# Patient Record
Sex: Female | Born: 1935 | Race: White | Hispanic: No
Health system: Southern US, Community
[De-identification: ages and names within clinical notes are randomized; demographics above are authoritative.]

---

## 2004-08-01 ENCOUNTER — Other Ambulatory Visit: Payer: Self-pay

## 2005-01-26 ENCOUNTER — Emergency Department: Payer: Self-pay | Admitting: Emergency Medicine

## 2005-02-09 ENCOUNTER — Emergency Department: Payer: Self-pay | Admitting: Internal Medicine

## 2006-06-30 ENCOUNTER — Emergency Department: Payer: Self-pay | Admitting: Emergency Medicine

## 2009-09-09 ENCOUNTER — Emergency Department: Payer: Self-pay | Admitting: Emergency Medicine

## 2010-12-28 ENCOUNTER — Emergency Department: Payer: Self-pay | Admitting: Emergency Medicine

## 2012-03-09 IMAGING — CT CT HEAD WITHOUT CONTRAST
2 series · 16 of 30 positions shown, 20 images · non-contrast
Comparison: none

REASON FOR EXAM: fall
COMMENTS:

PROCEDURE:     CT  - CT HEAD WITHOUT CONTRAST  - December 28, 2010  [DATE]
RESULT:     Head CT dated 12/28/2010
TECHNIQUE: A helical noncontrasted 5 mm sections were obtained from the
skull base to vertex.

[Series 2: without · axial · non-contrast · 0.43mm/px · z∈[+302,+422]mm · 13 of 30 slices shown, 17 images]
[im 3/30  brain]
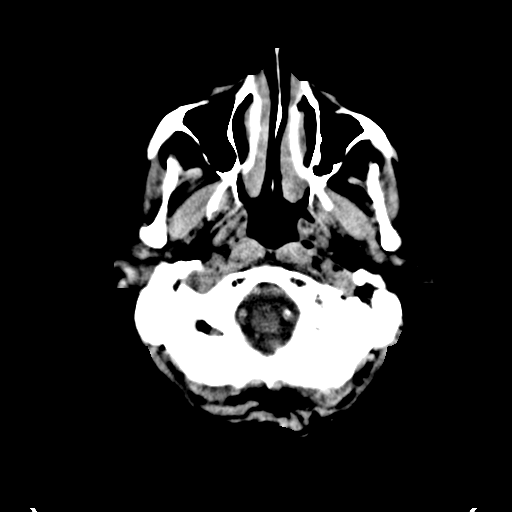
[im 3/30  bone]
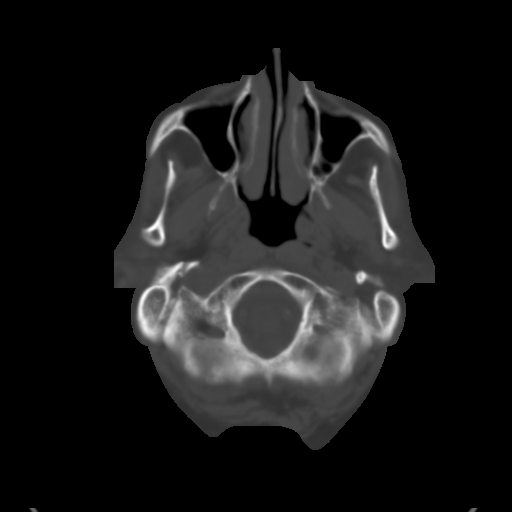
[im 5/30  brain]
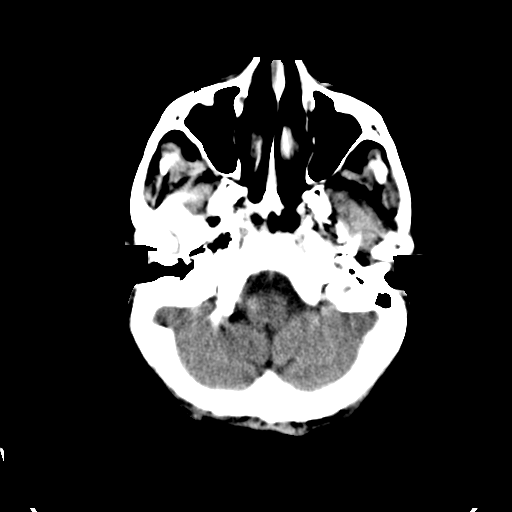
[im 7/30  brain]
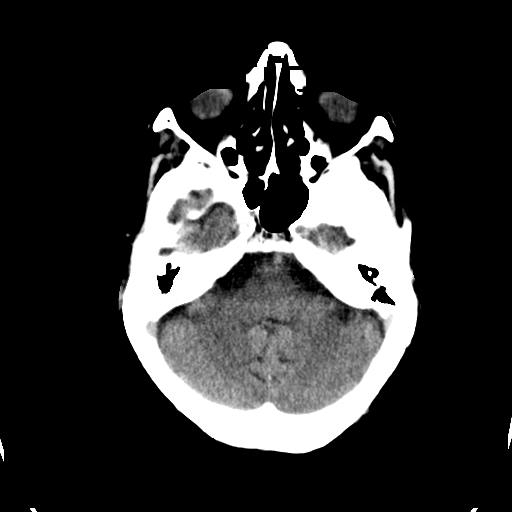
[im 9/30  brain]
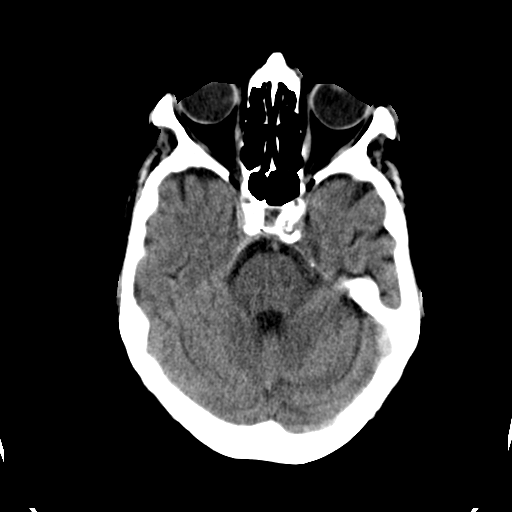
[im 11/30  brain]
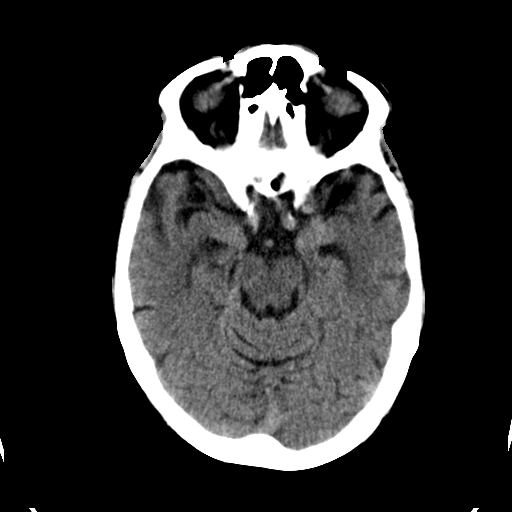
[im 11/30  bone]
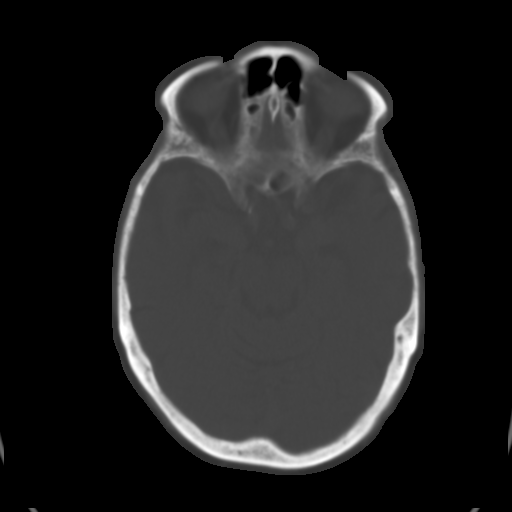
[im 13/30  brain]
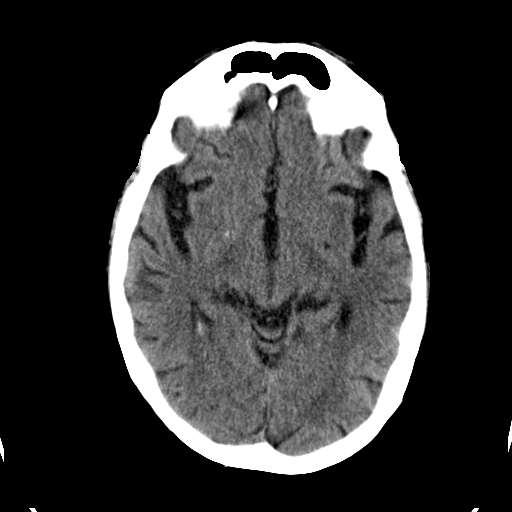
[im 15/30  brain]
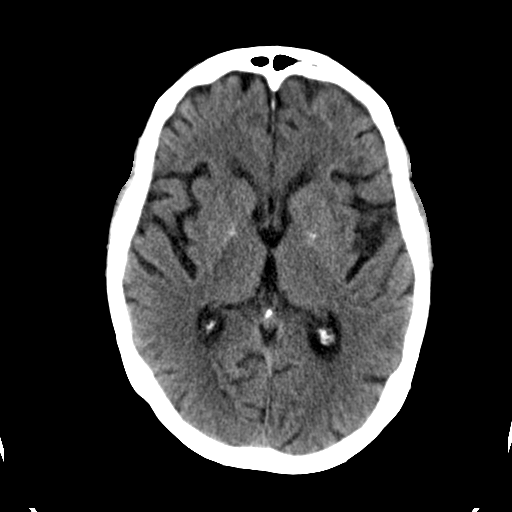
[im 17/30  brain]
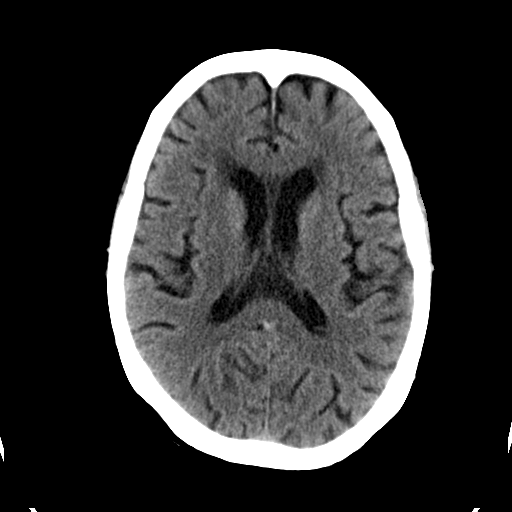
[im 19/30  brain]
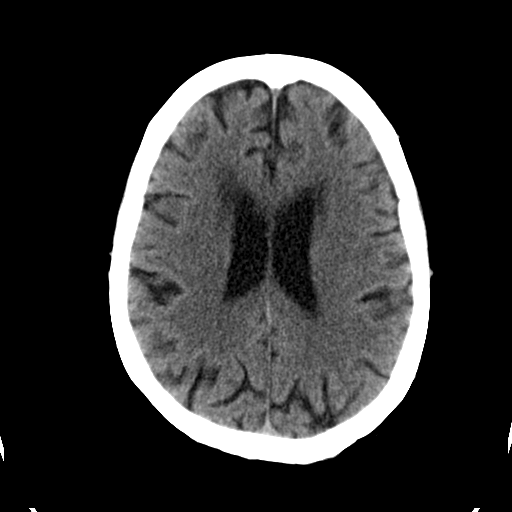
[im 19/30  bone]
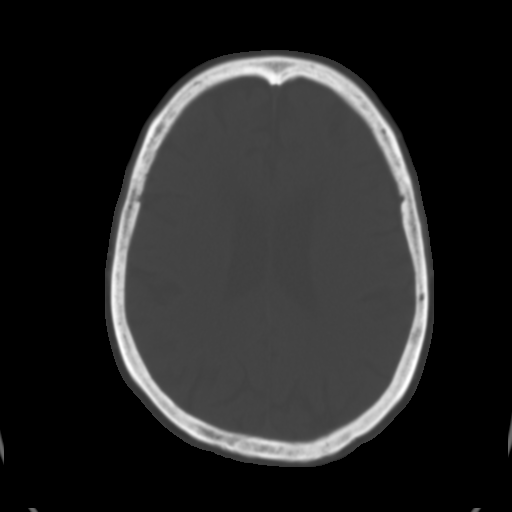
[im 21/30  brain]
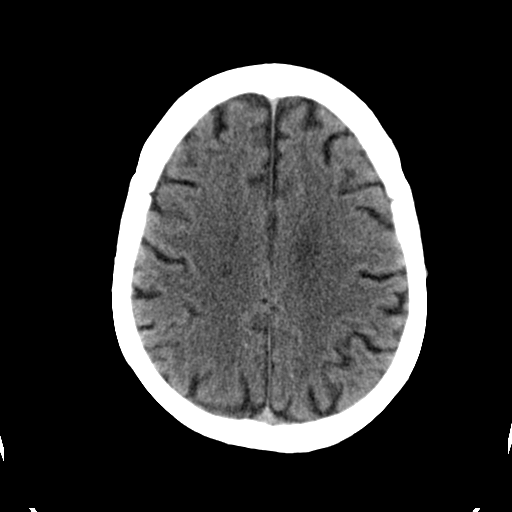
[im 23/30  brain]
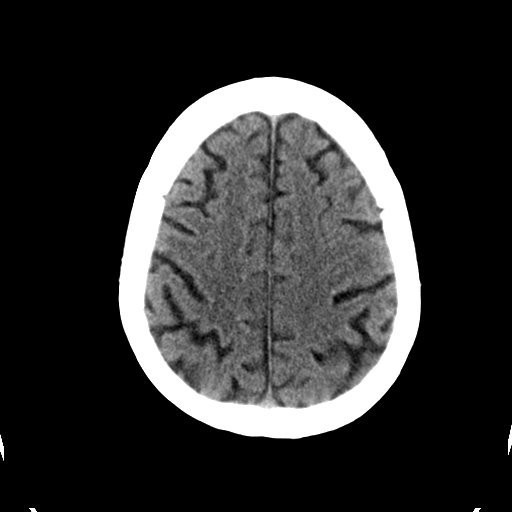
[im 25/30  brain]
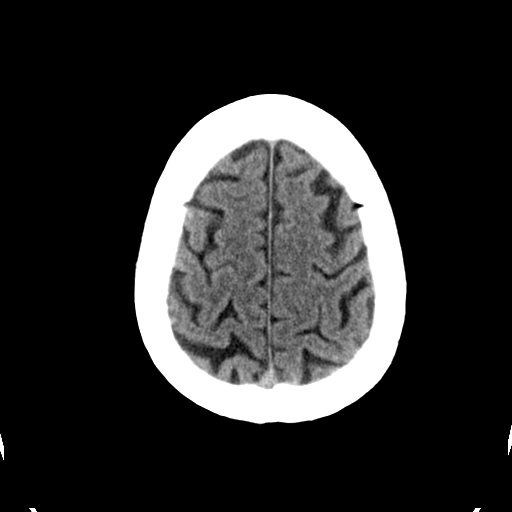
[im 27/30  brain]
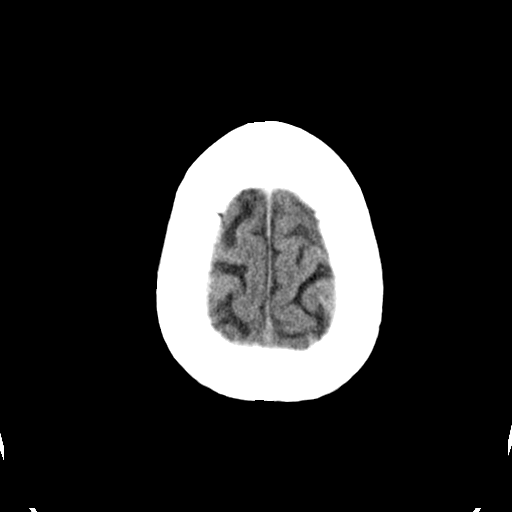
[im 27/30  bone]
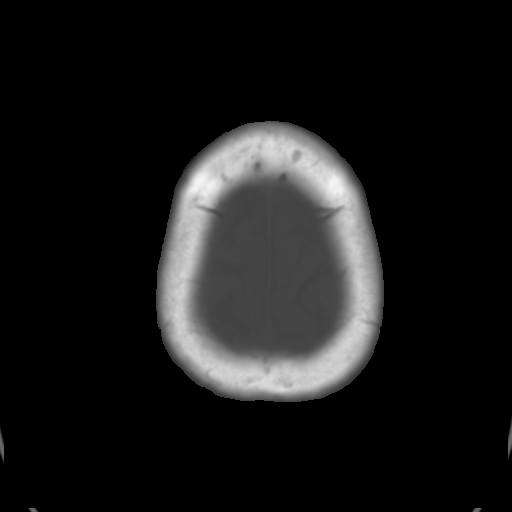

[Series 3: bone · axial · 0.43mm/px · z∈[+302,+342]mm · 3 of 30 slices shown]
[im 3/30  bone]
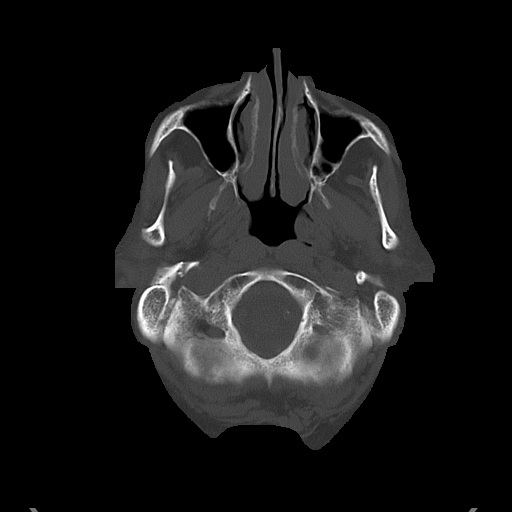
[im 7/30  bone]
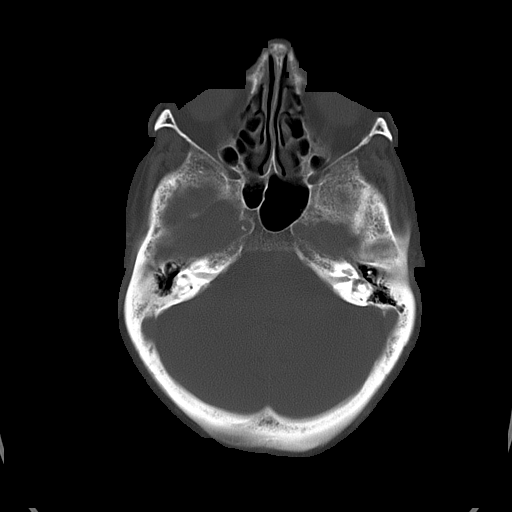
[im 11/30  bone]
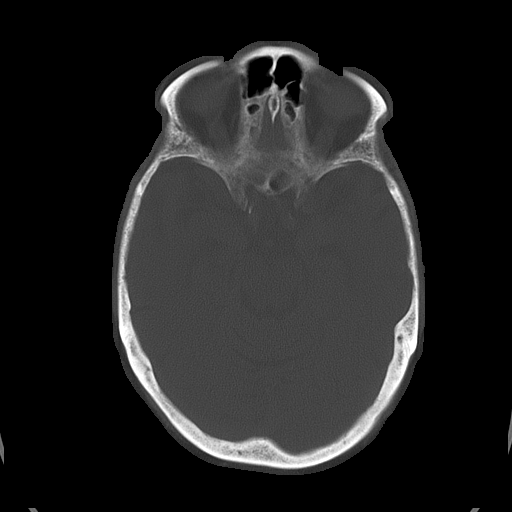

[16 of 30 positions shown; findings below may reference images not displayed]

FINDINGS: There is no evidence of intra-axial nor extra-axial fluid
collections nor evidence of acute hemorrhage. There is diffuse cortical
atrophy and areas of low attenuation within the subcortical, deep, and
periventricular white matter regions. Basal ganglial calcifications
identified.
The ventricles and cisterns are patent. There is no evidence of mass effect.
There is no evidence of a depressed skull fracture. Gross evaluation of the
pons and cerebellum is unremarkable.
IMPRESSION: Involutional changes without evidence of focal or acute
abnormalities.

## 2012-04-23 ENCOUNTER — Emergency Department: Payer: Self-pay | Admitting: Emergency Medicine

## 2012-04-23 LAB — BASIC METABOLIC PANEL
Anion Gap: 11 (ref 7–16)
BUN: 15 mg/dL (ref 7–18)
Creatinine: 1.17 mg/dL (ref 0.60–1.30)
EGFR (African American): 50 — ABNORMAL LOW
EGFR (Non-African Amer.): 43 — ABNORMAL LOW
Osmolality: 292 (ref 275–301)
Potassium: 4.2 mmol/L (ref 3.5–5.1)
Sodium: 144 mmol/L (ref 136–145)

## 2012-04-23 LAB — CBC WITH DIFFERENTIAL/PLATELET
Basophil #: 0.1 10*3/uL (ref 0.0–0.1)
Basophil %: 1.2 %
Eosinophil %: 2.7 %
Lymphocyte %: 32.3 %
MCH: 28.5 pg (ref 26.0–34.0)
MCHC: 31.6 g/dL — ABNORMAL LOW (ref 32.0–36.0)
MCV: 90 fL (ref 80–100)
Monocyte #: 0.6 x10 3/mm (ref 0.2–0.9)
Monocyte %: 8.5 %
Neutrophil %: 55.3 %
Platelet: 189 10*3/uL (ref 150–440)
RBC: 4.78 10*6/uL (ref 3.80–5.20)
WBC: 6.5 10*3/uL (ref 3.6–11.0)

## 2012-04-27 ENCOUNTER — Inpatient Hospital Stay: Payer: Self-pay | Admitting: Internal Medicine

## 2012-04-27 LAB — CBC WITH DIFFERENTIAL/PLATELET
Basophil #: 0.1 10*3/uL (ref 0.0–0.1)
Lymphocyte #: 2 10*3/uL (ref 1.0–3.6)
Lymphocyte %: 26.7 %
MCH: 29.1 pg (ref 26.0–34.0)
MCHC: 32.9 g/dL (ref 32.0–36.0)
MCV: 88 fL (ref 80–100)
Neutrophil #: 4.5 10*3/uL (ref 1.4–6.5)
Neutrophil %: 62.2 %
Platelet: 200 10*3/uL (ref 150–440)
RBC: 4.72 10*6/uL (ref 3.80–5.20)

## 2012-04-27 LAB — URINALYSIS, COMPLETE
Bilirubin,UR: NEGATIVE
Glucose,UR: NEGATIVE mg/dL (ref 0–75)
Ketone: NEGATIVE
Leukocyte Esterase: NEGATIVE
Ph: 6 (ref 4.5–8.0)
RBC,UR: <1 /HPF (ref 0–5)
Squamous Epithelial: <1

## 2012-04-27 LAB — COMPREHENSIVE METABOLIC PANEL
Bilirubin,Total: 0.6 mg/dL (ref 0.2–1.0)
Calcium, Total: 8.9 mg/dL (ref 8.5–10.1)
EGFR (Non-African Amer.): 41 — ABNORMAL LOW
Glucose: 135 mg/dL — ABNORMAL HIGH (ref 65–99)
SGPT (ALT): 20 U/L
Sodium: 140 mmol/L (ref 136–145)
Total Protein: 7 g/dL (ref 6.4–8.2)

## 2012-04-28 LAB — CBC WITH DIFFERENTIAL/PLATELET
Basophil #: 0 10*3/uL (ref 0.0–0.1)
Basophil %: 0.7 %
Lymphocyte #: 1.7 10*3/uL (ref 1.0–3.6)
MCH: 29.1 pg (ref 26.0–34.0)
Monocyte %: 8.7 %
Neutrophil #: 3.8 10*3/uL (ref 1.4–6.5)
RBC: 4.67 10*6/uL (ref 3.80–5.20)
RDW: 13.6 % (ref 11.5–14.5)

## 2012-04-28 LAB — BASIC METABOLIC PANEL
Calcium, Total: 8.9 mg/dL (ref 8.5–10.1)
Glucose: 98 mg/dL (ref 65–99)

## 2012-04-28 LAB — HEMOGLOBIN A1C: Hemoglobin A1C: 6.8 % — ABNORMAL HIGH (ref 4.2–6.3)

## 2012-04-30 LAB — BASIC METABOLIC PANEL
Anion Gap: 10 (ref 7–16)
BUN: 16 mg/dL (ref 7–18)
Calcium, Total: 8.6 mg/dL (ref 8.5–10.1)
Chloride: 104 mmol/L (ref 98–107)
Creatinine: 1.05 mg/dL (ref 0.60–1.30)
EGFR (African American): 56 — ABNORMAL LOW
Glucose: 127 mg/dL — ABNORMAL HIGH (ref 65–99)
Osmolality: 280 (ref 275–301)
Potassium: 3.9 mmol/L (ref 3.5–5.1)

## 2012-05-01 LAB — WOUND CULTURE

## 2012-05-03 LAB — CULTURE, BLOOD (SINGLE)

## 2012-05-15 ENCOUNTER — Emergency Department: Payer: Self-pay | Admitting: *Deleted

## 2012-05-15 LAB — CBC
MCHC: 32.2 g/dL (ref 32.0–36.0)
Platelet: 221 10*3/uL (ref 150–440)
RDW: 14.1 % (ref 11.5–14.5)

## 2012-05-15 LAB — BASIC METABOLIC PANEL
Anion Gap: 6 — ABNORMAL LOW (ref 7–16)
BUN: 23 mg/dL — ABNORMAL HIGH (ref 7–18)
Co2: 26 mmol/L (ref 21–32)
Creatinine: 1.24 mg/dL (ref 0.60–1.30)
EGFR (African American): 46 — ABNORMAL LOW
EGFR (Non-African Amer.): 40 — ABNORMAL LOW
Glucose: 76 mg/dL (ref 65–99)
Potassium: 4 mmol/L (ref 3.5–5.1)

## 2012-05-15 LAB — URINALYSIS, COMPLETE
Bilirubin,UR: NEGATIVE
Ketone: NEGATIVE
Leukocyte Esterase: NEGATIVE
Nitrite: NEGATIVE
Ph: 6 (ref 4.5–8.0)
Protein: NEGATIVE
RBC,UR: 1 /HPF (ref 0–5)
Specific Gravity: 1.011 (ref 1.003–1.030)
Squamous Epithelial: 1
WBC UR: 2 /HPF (ref 0–5)

## 2013-11-26 ENCOUNTER — Emergency Department: Payer: Self-pay | Admitting: Emergency Medicine

## 2014-05-15 ENCOUNTER — Emergency Department: Payer: Self-pay | Admitting: Emergency Medicine

## 2014-05-15 LAB — URINALYSIS, COMPLETE
Bilirubin,UR: NEGATIVE
Blood: NEGATIVE
Glucose,UR: NEGATIVE mg/dL (ref 0–75)
NITRITE: NEGATIVE
Ph: 6 (ref 4.5–8.0)
Protein: NEGATIVE
RBC,UR: 1 /HPF (ref 0–5)
Specific Gravity: 1.019 (ref 1.003–1.030)
WBC UR: 6 /HPF (ref 0–5)

## 2014-05-15 LAB — CBC
HCT: 46.3 % (ref 35.0–47.0)
HGB: 15.3 g/dL (ref 12.0–16.0)
MCH: 30 pg (ref 26.0–34.0)
MCHC: 33.1 g/dL (ref 32.0–36.0)
MCV: 91 fL (ref 80–100)
Platelet: 158 10*3/uL (ref 150–440)
RBC: 5.11 10*6/uL (ref 3.80–5.20)
RDW: 14.1 % (ref 11.5–14.5)
WBC: 6.2 10*3/uL (ref 3.6–11.0)

## 2014-05-15 LAB — COMPREHENSIVE METABOLIC PANEL
ANION GAP: 6 — AB (ref 7–16)
Albumin: 3.5 g/dL (ref 3.4–5.0)
Alkaline Phosphatase: 73 U/L
BUN: 19 mg/dL — ABNORMAL HIGH (ref 7–18)
Bilirubin,Total: 0.7 mg/dL (ref 0.2–1.0)
CO2: 28 mmol/L (ref 21–32)
Calcium, Total: 9.5 mg/dL (ref 8.5–10.1)
Chloride: 103 mmol/L (ref 98–107)
Creatinine: 1.21 mg/dL (ref 0.60–1.30)
GFR CALC AF AMER: 47 — AB
GFR CALC NON AF AMER: 40 — AB
GLUCOSE: 158 mg/dL — AB (ref 65–99)
Osmolality: 279 (ref 275–301)
POTASSIUM: 4 mmol/L (ref 3.5–5.1)
SGOT(AST): 37 U/L (ref 15–37)
SGPT (ALT): 24 U/L (ref 12–78)
Sodium: 137 mmol/L (ref 136–145)
Total Protein: 7.4 g/dL (ref 6.4–8.2)

## 2014-05-15 LAB — LIPASE, BLOOD: LIPASE: 99 U/L (ref 73–393)

## 2014-11-21 DIAGNOSIS — N3941 Urge incontinence: Secondary | ICD-10-CM | POA: Diagnosis not present

## 2014-12-10 DIAGNOSIS — N189 Chronic kidney disease, unspecified: Secondary | ICD-10-CM | POA: Diagnosis not present

## 2014-12-10 DIAGNOSIS — D649 Anemia, unspecified: Secondary | ICD-10-CM | POA: Diagnosis not present

## 2014-12-10 DIAGNOSIS — E119 Type 2 diabetes mellitus without complications: Secondary | ICD-10-CM | POA: Diagnosis not present

## 2014-12-10 DIAGNOSIS — E780 Pure hypercholesterolemia: Secondary | ICD-10-CM | POA: Diagnosis not present

## 2014-12-15 DIAGNOSIS — J811 Chronic pulmonary edema: Secondary | ICD-10-CM | POA: Diagnosis not present

## 2015-02-17 ENCOUNTER — Emergency Department
Admit: 2015-02-17 | Disposition: A | Discharge status: Other Acute Inpatient Hospital | Payer: Self-pay | Admitting: Internal Medicine

## 2015-02-17 ENCOUNTER — Ambulatory Visit (HOSPITAL_COMMUNITY)
Admission: AD | Admit: 2015-02-17 | Discharge: 2015-02-17 | Disposition: A | Discharge status: Home / Self Care | Payer: Medicare Other | Source: Other Acute Inpatient Hospital | Attending: Internal Medicine | Admitting: Internal Medicine

## 2015-02-17 DIAGNOSIS — S7290XA Unspecified fracture of unspecified femur, initial encounter for closed fracture: Secondary | ICD-10-CM | POA: Diagnosis present

## 2015-02-17 DIAGNOSIS — X58XXXA Exposure to other specified factors, initial encounter: Secondary | ICD-10-CM | POA: Diagnosis not present

## 2015-02-17 LAB — COMPREHENSIVE METABOLIC PANEL
AST: 23 U/L
Albumin: 3.5 g/dL
Alkaline Phosphatase: 85 U/L
Anion Gap: 8 (ref 7–16)
BUN: 23 mg/dL — ABNORMAL HIGH
Bilirubin,Total: 0.9 mg/dL
CO2: 27 mmol/L
Calcium, Total: 9.1 mg/dL
Chloride: 99 mmol/L — ABNORMAL LOW
Creatinine: 1.26 mg/dL — ABNORMAL HIGH
GFR CALC AF AMER: 44 — AB
GFR CALC NON AF AMER: 38 — AB
GLUCOSE: 209 mg/dL — AB
Potassium: 4.6 mmol/L
SGPT (ALT): 18 U/L
Sodium: 134 mmol/L — ABNORMAL LOW
Total Protein: 6.7 g/dL

## 2015-02-17 LAB — PROTIME-INR
INR: 1.1
Prothrombin Time: 14.4 secs

## 2015-02-17 LAB — CBC
HCT: 41.9 % (ref 35.0–47.0)
HGB: 14.1 g/dL (ref 12.0–16.0)
MCH: 29.7 pg (ref 26.0–34.0)
MCHC: 33.6 g/dL (ref 32.0–36.0)
MCV: 88 fL (ref 80–100)
PLATELETS: 169 10*3/uL (ref 150–440)
RBC: 4.74 10*6/uL (ref 3.80–5.20)
RDW: 13.3 % (ref 11.5–14.5)
WBC: 12.7 10*3/uL — AB (ref 3.6–11.0)

## 2015-03-10 NOTE — H&P (Signed)
PATIENT NAME:  Christine Summers, Christine Summers MR#:  [id number] DATE OF BIRTH:  1924-12-20  DATE OF ADMISSION:  04/27/2012  REFERRING PHYSICIAN: Dr. Robet Leu PRIMARY CARE PHYSICIAN: Dr. Sabra Heck at [place], [place]: "My leg has been getting worse."   HISTORY OF PRESENT ILLNESS: Patient is a pleasant 81 year old female with past medical history as listed below presents to the Emergency Department with the above-mentioned chief complaint. Patient comes accompanied by her daughter today who provides additional history. About nine days ago patient was getting into her daughter's car and struck her distal/posterior left leg against the metal rail of car seat and thereafter has noted some bleeding from the leg to which gauze and pressure was applied and eventually the bleeding had stopped as patient had a first aid kit and her car. Later in the week she noted that the distal left lower extremity started developing some erythema with worsening pain. Erythema then started to spread over the week, erythema started to spread from the calf region down to the left foot and thereafter she came to the ER for further evaluation. She was given a prescription for Keflex and sent home. Since that time her symptoms have actually worsened and she returns today with worsening pain and erythema involving the left lower extremity and left foot. She denies fevers or chills. She denies any purulent drainage from the leg or any bloody drainage. Otherwise, she is without specific complaints at this time. Hospitalist services were contacted for further evaluation.   PAST SURGICAL HISTORY:  1. Left knee.  2. Right hip replacement. 3. Left hip replacement. 4. Cholecystectomy.   PAST MEDICAL HISTORY: 1. Arthritis, unspecified.  2. Diabetes mellitus, probably type 2 as patient takes pills and in the chart she has a history listed of type 1 diabetes mellitus.  3. Stage I Alzheimer's disease.   ALLERGIES: Codeine.   HOME  MEDICATIONS: Home medications need to be verified. She takes a diabetic pill, may be 1 or 2 per the daughter. She takes a pill for early dementia and patient states she takes a kidney pill otherwise and she has been on Keflex 500 mg p.o. q.i.d. since 04/24/2012.  FAMILY HISTORY: Positive for diabetes mellitus.   SOCIAL HISTORY: Negative for tobacco, alcohol, or illicit drug use.   REVIEW OF SYSTEMS: CONSTITUTIONAL: Denies fevers, chills, recent changes in weight or weakness. Has some pain in the left leg. HEAD/EYES: Denies headache or blurry vision. ENT: Denies tinnitus, earache, nasal discharge, or sore throat. RESPIRATORY: Denies shortness breath, cough, or wheeze. CARDIOVASCULAR: Denies chest pain, heart palpitations, or lower extremity edema. GASTROINTESTINAL: Denies nausea, vomiting, diarrhea, constipation, melena, hematochezia, abdominal pain. GENITOURINARY: Denies dysuria, hematuria. ENDOCRINE: Denies heat or cold intolerance. HEME/LYMPH: Denies easy bruising. Had some bleeding from the left leg noted earlier which has since resolved. INTEGUMENTARY: Reports redness on left leg and left foot. MUSCULOSKELETAL: Has some left leg pain. Has chronic left hip pain which has not recently worsened. Denies back pain or muscle weakness. NEUROLOGIC: Denies headache, numbness, weakness, tingling, or dysarthria. PSYCHIATRIC: Denies depression or anxiety.  PHYSICAL EXAMINATION: VITAL SIGNS: Temperature 97.7, pulse 64, blood pressure 140/67, respirations 20, oxygen saturation 98% on room air.   GENERAL: Pleasant female, alert and oriented, not acutely distressed.   HEENT: Normocephalic, atraumatic. Pupils are equal, round and reactive to light accommodation. Extraocular muscles are intact. Anicteric sclerae. Conjunctivae pink. Hearing intact to voice. Nares without drainage. Oral mucosa moist without lesions.   NECK: Supple. Full range of  motion. No JVD, lymphadenopathy or carotid bruits bilaterally. No  thyromegaly or tenderness to palpation over thyroid gland.   CHEST: Normal respiratory effort without use of accessory respiratory muscles.   LUNGS: Lungs are clear to auscultation bilaterally without crackles, rales, or wheezes.   CARDIOVASCULAR: S1, S2 positive. Regular rate and rhythm. No murmurs, rubs, or gallops. PMI is non-lateralized.   ABDOMEN: Soft, nontender, nondistended. Normoactive bowel sounds. No hepatosplenomegaly or palpable masses. No hernias.   EXTREMITIES: No clubbing, cyanosis, or edema. Pedal pulses are palpable bilaterally. In  the left lower extremity she has got an area of confluent erythema from the calf region extending along the lateral aspect of the distal left lower extremity extending to the lateral left foot with some warmth and she has a laceration which appears to be healing. No active drainage. No purulent drainage. She has got some denuded skin there. No fluctuance. No significant tenderness to palpation.   SKIN: As above with erythema involving the left lower extremity and some warmth. Otherwise, skin turgor is good. Otherwise, no suspicious rashes.   LYMPH: No cervical lymphadenopathy. No inguinal adenopathy.   NEUROLOGIC: Alert, oriented x3. Cranial nerves II through XII grossly intact. No focal deficits.   PSYCH: Pleasant female with appropriate affect.   LABORATORY, DIAGNOSTIC, AND RADIOLOGICAL DATA:  CBC within normal limits.   CMP within normal limits except for serum glucose 135, serum albumin 3.3 estimated GFR 41.   Left lower extremity venous Doppler ultrasound is pending at this time.   ASSESSMENT AND PLAN: 81 year old female with history of diabetes mellitus and arthritis here with worsening left lower extremity erythema and warmth and some pain with left lower extremity cellulitis, failing outpatient measures with:  1. Left lower extremity cellulitis-Failing outpatient measures for which we recommend hospital admission for further  evaluation and management. Will keep patient on broad-spectrum IV antibiotics in the form of vancomycin and Unasyn for now and monitor clinical response. If there is no improvement will consider imaging of the left lower extremity such as CT scan and possible surgical versus ID evaluation. Also right lower extremity Doppler. Provide pain control as needed but at this time she states she is comfortable and denies any acute pain. Monitor clinical response closely. Further work-up and management to follow depending on patient's clinical course. 2. Diabetes mellitus-Need to confirm her home medications. Keep her on sliding scale insulin for now. Monitor sugars closely and also check hemoglobin A1c.  3. Elevated blood pressure without diagnosed hypertension-Blood pressure elevation could be pain induced. Need to also verify her home medications as she states she takes a "kidney pill" which may represent an ACE inhibitor versus ARB. For now will provide pain control and p.r.n. IV hydralazine and monitor blood pressure closely. 4. Arthritis-Stable. She denies any arthralgias at this time. She has chronic hip pain which has not recently worsened. Will provide p.r.n. pain control as needed. 5. Alzheimer's dementia-Need to confirm patient's home medications as her daughter states she was recently started on a medication for dementia and daughter will bring in patient's pill bottles.  6. Deep vein thrombosis prophylaxis with Lovenox and encourage early ambulation. 7. CODE STATUS: FULL CODE.   TIME SPENT ON THIS ADMISSION: Approximately 45 minutes.   ____________________________ Romie Jumper, MD knl:cms D: 04/27/2012 20:43:05 ET T: 04/28/2012 08:05:36 ET JOB#: [id number]  cc: Romie Jumper, MD, <Dictator> Dr. Sabra Heck, [facility], [facility] Dearl Rudden MD ELECTRONICALLY SIGNED 05/17/2012 1:23

## 2015-03-10 NOTE — Consult Note (Signed)
Brief Consult Note: Diagnosis: left leg cellulitis, resolving, no need for surgical intervention.   Patient was seen by consultant.   Consult note dictated.   Orders entered.   Comments: neosporing to exposed dermal area and gauze.  no need for follow-up in our office. full note dictated.  Electronic Signatures: Sherri Rad (MD)  (Signed 14-Jun-13 23:03)  Authored: Brief Consult Note   Last Updated: 14-Jun-13 23:03 by Sherri Rad (MD)

## 2015-03-10 NOTE — Consult Note (Signed)
PATIENT NAME:  Christine Summers, Christine Summers MR#:  [id number] DATE OF BIRTH:  1925-06-30  DATE OF CONSULTATION:  04/29/2012  CONSULTING PHYSICIAN:  Elta Guadeloupe A. Marina Gravel, MD  REASON FOR CONSULTATION: Left leg cellulitis, question of a need for debridement.   HISTORY OF PRESENT ILLNESS: The patient is an 81 year old female who injured her leg nine days prior to her admission to the hospital on June 12th getting into a car onto the left posterior lower leg. She developed cellulitis. This was treated as an outpatient with Keflex. Pain in the foot got worse. There was no purulent drainage or bloody drainage. She came to the hospital on June 12th for further evaluation and management and was admitted to the Medical Service for left leg cellulitis. Over the course of her hospitalization, she has been being treated with intravenous Unasyn. She was also begun on intravenous vancomycin. The leg, by the nurses report, is less cellulitic than on admission. Surgical Services were consulted regarding further management of an eschar on the posterior aspect of her leg.   ALLERGIES: Codeine.   CURRENT MEDICATIONS: Vancomycin, Unasyn, Aricept, Mevacor, Lovenox, glipizide, sliding scale insulin, Norco, morphine and Zofran.   PAST MEDICAL HISTORY: Significant for: 1. Arthritis.  2. Type 2 diabetes. 3. Alzheimer's disease.   PAST SURGICAL HISTORY:  1. Left knee surgery. 2. Right hip replacement. 3. Left hip replacement. 4. Cholecystectomy.   SOCIAL HISTORY: Negative for tobacco, alcohol and drug use.   FAMILY HISTORY: Significant for diabetes.   REVIEW OF SYSTEMS: Grossly negative. The patient has no pain in her left leg.  PHYSICAL EXAMINATION:  GENERAL: The patient is alert and oriented.   VITAL SIGNS: Temperature is 98.2, pulse 71, respiratory rate 17, blood pressure is 115/77.   LEFT LOWER EXTREMITY: Directed examination of her left lower extremity demonstrates a 2 x 2 cm dry eschar which was easily removed exposing  healing dermal elements. No evidence of undrained purulence was noted. There was some very, very faint erythema of her left leg. Her legs were spindly. Skin was otherwise intact.   LABORATORY DATA: Laboratory values were reviewed. White count 6.3, hemoglobin 13.6, platelet count 185,000. Serum albumin 3.3. Hemoglobin A1c was 6.8, creatinine 0.97.   IMPRESSION: Resolving left leg cellulitis, dry eschar resulting from the trauma greater than 10 days ago. There is no surgical indication for drainage. Dressing changes were written for. Neosporin should cover, should cover this area was dry gauze. This will heal on its own. No surgical intervention is required. I discussed with the patient and the nurses.   ____________________________ Jeannette How Marina Gravel, MD mab:cbb D: 04/29/2012 59:97:74 ET T: 04/30/2012 10:42:41 ET JOB#: [id number]  cc: Elta Guadeloupe A. Marina Gravel, MD, <Dictator> Anokhi Shannon A Cecely Rengel MD ELECTRONICALLY SIGNED 05/02/2012 21:22

## 2015-03-10 NOTE — Discharge Summary (Signed)
PATIENT NAME:  Christine Summers, Christine Summers MR#:  [id number] DATE OF BIRTH:  1924/12/31  DATE OF ADMISSION:  04/27/2012 DATE OF DISCHARGE:  04/30/2012  ADMITTING PHYSICIAN: Dagoberto Ligas, MD  DISCHARGING PHYSICIAN: [place] Lighter, MD  CONSULTANTS: Sherri Rad, MD - Surgery.  DISCHARGE DIAGNOSES:  1. Left lower extremity methicillin-resistant Staphylococcus aureus cellulitis and ulcer. 2. Diabetes mellitus.  3. Hyperlipidemia.  4. Dementia.  5. Arthritis.  DISCHARGE MEDICATIONS:  1. Glyburide 5 mg p.o. daily.  2. Metformin 850 mg p.o. twice a day. 3. Lovastatin 40 mg p.o. daily.  4. Donepezil 10 mg p.o. daily.  5. Oxybutynin 10 mg daily.  6. Norco 5/325 mg 1 tablet p.o. every six hours p.r.n.  7. Bactrim double-strength 1 tablet p.o. twice a day.  DISCHARGE DIET: ADA diet.   DISCHARGE ACTIVITY: As tolerated.  FOLLOWUP INSTRUCTIONS: PCP followup in 1 to 2 weeks and drink adequate amount of fluids.   LABS AND IMAGING STUDIES: WBC 6.3, hemoglobin 13.6, hematocrit 41.8, and platelet count 185.  Sodium 139, potassium 3.9, chloride 104, bicarbonate 25, BUN 16, creatinine 1.05, glucose 127, and calcium 8.6.   HbA1c 6.8.   Lower extremity ultrasound is showing no evidence of any deep venous thrombosis, in the left lower extremity.   Urinalysis is negative for any infection.  Wound culture is positive for MRSA, sensitive to Bactrim.   Blood cultures have been negative.   BRIEF HOSPITAL COURSE: Christine Summers is an 81 year old elderly female with past medical history significant for arthritis, diabetes, hypertension, and Alzheimer's disease who was brought in after she had cellulitis of the left lower extremity not improving as an outpatient with Keflex.  Left lower extremity cellulitis and ulcer: She was on Keflex as an outpatient and did not get improved. The ulcer developed after she had a trauma to the lower extremity. Her cultures in the hospital were growing MRSA and she was initially on  vancomycin and Unasyn. Once the sensitivities were back Unasyn was stopped and vancomycin is being changed to Bactrim at the time of discharge. Lower extremity is negative for any deep venous thrombosis. The initial ulcer had a black eschar on it and surgical consultation was requested who did debridement which exposed good clean granulation tissue under it. The patient's redness has improved and is only localized surrounding the ulcer at this time. She worked with physical therapy who recommended no further discharge needs and discharge home, so the patient is being discharged home on antibiotics. All her other home medications were resumed for her diabetes and dementia. Her course has been otherwise uneventful in the hospital.   DISCHARGE CONDITION: Stable.           DISCHARGE DISPOSITION: Home.   TIME SPENT ON DISCHARGE: 40 minutes. ____________________________ [place] Lighter, MD rk:slb D: 05/01/2012 12:03:41 ET T: 05/02/2012 10:39:52 ET JOB#: [id number]  cc: [place] Lighter, MD, <Dictator> Dr. Sabra Heck [facility]  Gladstone Lighter MD ELECTRONICALLY SIGNED 05/11/2012 15:23

## 2015-06-04 LAB — LIPID PANEL
Cholesterol: 116 mg/dL (ref 0–200)
HDL: 39 mg/dL (ref 35–70)
LDL Cholesterol: 65 mg/dL
Triglycerides: 108 mg/dL (ref 40–160)

## 2015-06-04 LAB — CBC AND DIFFERENTIAL
HEMATOCRIT: 40 % (ref 36–46)
Hemoglobin: 13.2 g/dL (ref 12.0–16.0)
Platelets: 179 10*3/uL (ref 150–399)
WBC: 7.1 10^3/mL

## 2015-06-04 LAB — BASIC METABOLIC PANEL
BUN: 22 mg/dL — AB (ref 4–21)
Creatinine: 1.1 mg/dL (ref 0.5–1.1)
Glucose: 167 mg/dL
POTASSIUM: 4.3 mmol/L (ref 3.4–5.3)
SODIUM: 136 mmol/L — AB (ref 137–147)

## 2015-06-04 LAB — HEPATIC FUNCTION PANEL
ALT: 16 U/L (ref 7–35)
AST: 20 U/L (ref 13–35)
Alkaline Phosphatase: 72 U/L (ref 25–125)
BILIRUBIN, TOTAL: 0.4 mg/dL

## 2015-06-04 LAB — HEMOGLOBIN A1C: HEMOGLOBIN A1C: 7.8

## 2015-12-30 ENCOUNTER — Encounter: Payer: Self-pay | Admitting: Internal Medicine

## 2015-12-30 ENCOUNTER — Non-Acute Institutional Stay (SKILLED_NURSING_FACILITY): Payer: Medicare Other | Admitting: Internal Medicine

## 2015-12-30 DIAGNOSIS — G309 Alzheimer's disease, unspecified: Secondary | ICD-10-CM | POA: Diagnosis not present

## 2015-12-30 DIAGNOSIS — R0989 Other specified symptoms and signs involving the circulatory and respiratory systems: Secondary | ICD-10-CM | POA: Diagnosis not present

## 2015-12-30 DIAGNOSIS — F0280 Dementia in other diseases classified elsewhere without behavioral disturbance: Secondary | ICD-10-CM | POA: Diagnosis not present

## 2015-12-30 DIAGNOSIS — R32 Unspecified urinary incontinence: Secondary | ICD-10-CM

## 2015-12-30 DIAGNOSIS — E1122 Type 2 diabetes mellitus with diabetic chronic kidney disease: Secondary | ICD-10-CM

## 2015-12-30 DIAGNOSIS — D638 Anemia in other chronic diseases classified elsewhere: Secondary | ICD-10-CM | POA: Diagnosis not present

## 2015-12-30 DIAGNOSIS — N952 Postmenopausal atrophic vaginitis: Secondary | ICD-10-CM

## 2015-12-30 DIAGNOSIS — N183 Chronic kidney disease, stage 3 (moderate): Secondary | ICD-10-CM

## 2015-12-30 DIAGNOSIS — D692 Other nonthrombocytopenic purpura: Secondary | ICD-10-CM | POA: Diagnosis not present

## 2015-12-30 DIAGNOSIS — I69319 Unspecified symptoms and signs involving cognitive functions following cerebral infarction: Secondary | ICD-10-CM | POA: Diagnosis not present

## 2015-12-30 NOTE — Progress Notes (Signed)
Patient ID: Christine Summers, female   DOB: October 29, 1925, 82 y.o.   MRN: [id number]    LOCATION: [facility]  PCP: No primary care provider on file.   Code Status: No Code  Goals of care: Advanced Directive information No flowsheet data found.   Extended Emergency Contact Information Primary Emergency Contact: DUGGINS,REBECCA S Address: [address]          [place], [place] [address] Home Phone: [phone] Work Phone: [phone] Relation: None   Allergies  Allergen Reactions  . Codeine     Chief Complaint  Patient presents with  . New Admit To SNF    New Admission     HPI:  Patient is a 82 y.o. female seen today for long term care from another skilled nursing facility. She has medical history of alzheimer's disease, high fall risk, DM type 2, HLD, ckd 3, CVA among others. She is seen in her room today. She complaints of chest congestion. She denies any other concern this visit. She has some confusion.   Review of Systems:  Constitutional: Negative for fever, chills HENT: Negative for headache, congestion, nasal discharge, difficulty swallowing.   Eyes: Negative for blurred vision, double vision and discharge.  Respiratory: Negative for cough, shortness of breath and wheezing.   Cardiovascular: Negative for chest pain, palpitations.  Gastrointestinal: Negative for heartburn, nausea, vomiting, abdominal pain. Had bowel movement yesterday Genitourinary: Negative for dysuria Musculoskeletal: Negative for fall in this facility Skin: Negative for itching, rash.  Neurological: Negative for dizziness Psychiatric/Behavioral: Negative for depression  PMH- CKD 3, CVA, DM type 2, HLD, alzhemier's disease  Social History: Denies history of smoking and alcohol use  Family history: non contributory  Medications:   Medication List       This list is accurate as of: 12/30/15 11:25 AM.  Always use your most recent med list.               acetaminophen 325 MG tablet    Commonly known as:  TYLENOL  Take 650 mg by mouth every 6 (six) hours as needed (pain).     aspirin EC 81 MG tablet  Take 81 mg by mouth daily.     Cholecalciferol 1000 units tablet  Take 1,000 Units by mouth daily.     Cholecalciferol 50000 units capsule  Take 50,000 Units by mouth. By mouth in the morning every 30 days     conjugated estrogens vaginal cream  Commonly known as:  PREMARIN  Place 1 Applicatorful vaginally at bedtime. Insert 0.5 grams vaginally at bedtime     docusate sodium 100 MG capsule  Commonly known as:  COLACE  Take 100 mg by mouth 2 (two) times daily.     donepezil 10 MG tablet  Commonly known as:  ARICEPT  Take 10 mg by mouth at bedtime.     insulin glargine 100 UNIT/ML injection  Commonly known as:  LANTUS  Inject 10 Units into the skin daily.     lamoTRIgine 25 MG tablet  Commonly known as:  LAMICTAL  Take 25 mg by mouth 2 (two) times daily. Take 2 tablets= 50 mg by mouth twice daily     lovastatin 40 MG tablet  Commonly known as:  MEVACOR  Take 40 mg by mouth at bedtime.     Melatonin 3 MG Tabs  Take 3 mg by mouth at bedtime.     nystatin cream  Commonly known as:  MYCOSTATIN  Apply 1 application topically as needed for dry  skin. Nystatin 100,000 units/GM Cream. Apply topically to groin as needed for yeast     oxybutynin 5 MG tablet  Commonly known as:  DITROPAN  Take 5 mg by mouth 2 (two) times daily. Take 1/2 tablet= 2.5 mg by mouth twice a day     polyethylene glycol packet  Commonly known as:  MIRALAX / GLYCOLAX  Take 17 g by mouth daily.     saxagliptin HCl 2.5 MG Tabs tablet  Commonly known as:  ONGLYZA  Take 2.5 mg by mouth daily.     senna 8.6 MG tablet  Commonly known as:  SENOKOT  Take 1 tablet by mouth at bedtime.        Immunizations:  There is no immunization history on file for this patient.   Physical Exam:  Filed Vitals:   12/30/15 1059  BP: 126/69  Pulse: 95  Temp: 98.1 F (36.7 C)  TempSrc: Oral   Resp: 18  SpO2: 92%   General- elderly female, obese, in no acute distress Head- normocephalic, atraumatic Nose- no maxillary or frontal sinus tenderness, no nasal discharge Throat- moist mucus membrane, adentulous Eyes- PERRLA, EOMI, no pallor, no icterus, no discharge, normal conjunctiva, normal sclera Neck- no cervical lymphadenopathy Cardiovascular- normal s1,s2, no murmur Respiratory- bilateral decreased air movement, rhonchi + Abdomen- bowel sounds present, soft, non tender Musculoskeletal- able to move all 4 extremities, generalized weakness Neurological- alert and oriented to person and place Skin- warm and dry, senile purpura Psychiatry- normal mood, poor insight    Labs reviewed: Basic Metabolic Panel:  Recent Labs  02/17/15 1809 06/04/15  NA 134* 136*  K 4.6 4.3  CL 99*  --   CO2 27  --   GLUCOSE 209*  --   BUN 23* 22*  CREATININE 1.26* 1.1  CALCIUM 9.1  --    Liver Function Tests:  Recent Labs  02/17/15 1809 06/04/15  AST 23 20  ALT 18 16  ALKPHOS 85 72  BILITOT 0.9  --   PROT 6.7  --   ALBUMIN 3.5  --    No results for input(s): LIPASE, AMYLASE in the last 8760 hours. No results for input(s): AMMONIA in the last 8760 hours. CBC:  Recent Labs  02/17/15 1809 06/04/15  WBC 12.7* 7.1  HGB 14.1 13.2  HCT 41.9 40  MCV 88  --   PLT 169 179    Assessment/Plan  Alzheimer's disease Chronic, stable. Continue aricept and monitor. Continue assistance with her ADLs, fall precautions, pressure ulcer prophylaxis.   Type 2 dm Check a1c. Check cbg bid for now. Continue lantus 14 u for now with SSI.   Vitamin d def Continue vitamin d weekly supplement and check vitamin d level  Constipation Continue colace 100 mg bid, senokot daily and miralax daily and monitor  Insomnia Continue melatonin  Urinary incontinence Continue ditropan  Chest congestion Get cxr to evaluate further  HLD Check lipid panel, continue lipitor  Post menopausal  atrophic vaginitis Continue premarin cream  Senile purpura At high risk for skin tears, monitor, maintain moisture and gerisleeves adbvised  ckd 3 Monitor bmp  CVA Stable, continue aspirin and monitor bp  Anemia of chronic disease Monitor cbc   Goals of care: long term care   Labs/tests ordered: CXR PA/lat, cbcm cmp, lipid panel, a1c, vitamin d  Family/ staff Communication: reviewed care plan with patient and nursing supervisor    Blanchie Serve, MD Internal Medicine [place]  [place],  [address] Cell Phone (Monday-Friday 8 am - 5 pm): [phone] On Call: [phone] and follow prompts after 5 pm and on weekends Office Phone: [phone] Office Fax: [phone]

## 2015-12-31 LAB — HEMOGLOBIN A1C: HEMOGLOBIN A1C: 11.5

## 2015-12-31 LAB — HEPATIC FUNCTION PANEL
ALK PHOS: 115 U/L (ref 25–125)
ALT: 11 U/L (ref 7–35)
AST: 16 U/L (ref 13–35)
BILIRUBIN, TOTAL: 0.5 mg/dL

## 2015-12-31 LAB — BASIC METABOLIC PANEL
BUN: 13 mg/dL (ref 4–21)
CREATININE: 1 mg/dL (ref 0.5–1.1)
GLUCOSE: 243 mg/dL
Potassium: 4.2 mmol/L (ref 3.4–5.3)
Sodium: 134 mmol/L — AB (ref 137–147)

## 2015-12-31 LAB — LIPID PANEL
Cholesterol: 83 mg/dL (ref 0–200)
HDL: 28 mg/dL — AB (ref 35–70)
LDL CALC: 36 mg/dL
Triglycerides: 92 mg/dL (ref 40–160)

## 2015-12-31 LAB — CBC AND DIFFERENTIAL
HEMATOCRIT: 45 % (ref 36–46)
Hemoglobin: 14.6 g/dL (ref 12.0–16.0)
PLATELETS: 222 10*3/uL (ref 150–399)
WBC: 8.8 10^3/mL

## 2015-12-31 LAB — TSH: TSH: 2.26 u[IU]/mL (ref 0.41–5.90)

## 2016-01-01 ENCOUNTER — Non-Acute Institutional Stay (SKILLED_NURSING_FACILITY): Payer: Medicare Other | Admitting: Nurse Practitioner

## 2016-01-01 ENCOUNTER — Encounter: Payer: Self-pay | Admitting: Nurse Practitioner

## 2016-01-01 DIAGNOSIS — D62 Acute posthemorrhagic anemia: Secondary | ICD-10-CM | POA: Diagnosis not present

## 2016-01-01 DIAGNOSIS — E119 Type 2 diabetes mellitus without complications: Secondary | ICD-10-CM | POA: Insufficient documentation

## 2016-01-01 DIAGNOSIS — E785 Hyperlipidemia, unspecified: Secondary | ICD-10-CM | POA: Diagnosis not present

## 2016-01-01 DIAGNOSIS — Z794 Long term (current) use of insulin: Secondary | ICD-10-CM

## 2016-01-01 DIAGNOSIS — E1140 Type 2 diabetes mellitus with diabetic neuropathy, unspecified: Secondary | ICD-10-CM

## 2016-01-01 DIAGNOSIS — E559 Vitamin D deficiency, unspecified: Secondary | ICD-10-CM | POA: Diagnosis not present

## 2016-01-01 NOTE — Progress Notes (Signed)
Patient ID: Christine Summers, female   DOB: 1925-01-05, 82 y.o.   MRN: [id number]    Nursing Home Location:  [place] of Service: SNF (31)  PCP: No primary care provider on file.  Allergies  Allergen Reactions  . Codeine     Chief Complaint  Patient presents with  . Acute Visit    Abnormal labs/elevated A1c    HPI:  Patient is a 82 y.o. female seen today at [facility] to follow up labs. Pt with hx of dementia, DM, hyperlipidemia, vit d def, COPD, anemia, CKD and others. Pt is a new admit to [facility] for long term care and had routine lab work. Pt with dementia therefore is a poor historian. Recent A1c elevated at 11.5 and blood sugars have been over 200 when checked. Pt reports occasional neuropathy. No constipation or diarrhea. Denies blurred vision. Denies chest pains or shortness of breath. Staff has no concerns at this time.   Review of Systems:  Review of Systems  Constitutional: Negative for activity change, appetite change, fatigue and unexpected weight change.  Eyes: Negative.   Respiratory: Negative for cough and shortness of breath.   Cardiovascular: Negative for chest pain, palpitations and leg swelling.  Gastrointestinal: Negative for abdominal pain, diarrhea and constipation.  Genitourinary: Negative for dysuria and difficulty urinating.  Musculoskeletal: Negative for myalgias and arthralgias.  Skin: Negative for wound.  Neurological: Negative for dizziness.  Psychiatric/Behavioral: Positive for confusion. Negative for behavioral problems and agitation.       Dementia    History reviewed. No pertinent past medical history. History reviewed. No pertinent past surgical history. Social History:   has no tobacco, alcohol, and drug history on file.  History reviewed. No pertinent family history.  Medications: Patient's Medications  New Prescriptions   No medications on file  Previous Medications   ACETAMINOPHEN (TYLENOL) 325 MG TABLET    Take 650 mg by mouth every 6 (six) hours as needed (pain).   ASPIRIN EC 81 MG TABLET    Take 81 mg by mouth daily.   ATORVASTATIN (LIPITOR) 10 MG TABLET    Take 10 mg by mouth at bedtime.   CHOLECALCIFEROL 1000 UNITS TABLET    Take 1,000 Units by mouth daily.   CHOLECALCIFEROL 50000 UNITS CAPSULE    Take 1 capsule by mouth in the morning every 30 days   CONJUGATED ESTROGENS (PREMARIN) VAGINAL CREAM    Place 1 Applicatorful vaginally at bedtime. Insert 0.5 grams vaginally at bedtime   DOCUSATE SODIUM (COLACE) 100 MG CAPSULE    Take 100 mg by mouth 2 (two) times daily.   DONEPEZIL (ARICEPT) 10 MG TABLET    Take 10 mg by mouth at bedtime.   INSULIN GLARGINE (LANTUS) 100 UNIT/ML INJECTION    Inject 10 Units into the skin daily.   LAMOTRIGINE (LAMICTAL) 25 MG TABLET    Take 25 mg by mouth 2 (two) times daily. Take 2 tablets= 50 mg by mouth twice daily   MELATONIN 3 MG TABS    Take 3 mg by mouth at bedtime.   NYSTATIN CREAM (MYCOSTATIN)    Apply 1 application topically as needed for dry skin. Nystatin 100,000 units/GM Cream. Apply topically to groin as needed for yeast   OXYBUTYNIN (DITROPAN) 5 MG TABLET    Take 1/2 tablet= 2.5 mg by mouth twice a day   POLYETHYLENE GLYCOL (MIRALAX / GLYCOLAX) PACKET    Take 17 g by mouth  daily.   SAXAGLIPTIN HCL (ONGLYZA) 2.5 MG TABS TABLET    Take 2.5 mg by mouth daily.   SENNA (SENOKOT) 8.6 MG TABLET    Take 1 tablet by mouth at bedtime.  Modified Medications   No medications on file  Discontinued Medications   LOVASTATIN (MEVACOR) 40 MG TABLET    Take 40 mg by mouth at bedtime. Reported on 01/01/2016     Physical Exam: Filed Vitals:   01/01/16 1515  BP: 112/59  Pulse: 70  Temp: 99 F (37.2 C)  TempSrc: Oral  Resp: 18    Physical Exam  Constitutional: She appears well-developed and well-nourished. No distress.  HENT:  Head: Normocephalic and atraumatic.  Mouth/Throat: Oropharynx is clear and moist. No  oropharyngeal exudate.  Eyes: Conjunctivae are normal. Pupils are equal, round, and reactive to light.  Neck: Normal range of motion. Neck supple.  Cardiovascular: Normal rate, regular rhythm and normal heart sounds.   Pulmonary/Chest: Effort normal and breath sounds normal.  Abdominal: Soft. Bowel sounds are normal.  Musculoskeletal: She exhibits no edema.  Neurological: She is alert.  Skin: Skin is warm and dry. She is not diaphoretic.  Psychiatric: She has a normal mood and affect.    Labs reviewed: Basic Metabolic Panel:  Recent Labs  02/17/15 1809 06/04/15 12/31/15  NA 134* 136* 134*  K 4.6 4.3 4.2  CL 99*  --   --   CO2 27  --   --   GLUCOSE 209*  --   --   BUN 23* 22* 13  CREATININE 1.26* 1.1 1.0  CALCIUM 9.1  --   --    Liver Function Tests:  Recent Labs  02/17/15 1809 06/04/15 12/31/15  AST 23 20 16   ALT 18 16 11   ALKPHOS 85 72 115  BILITOT 0.9  --   --   PROT 6.7  --   --   ALBUMIN 3.5  --   --    No results for input(s): LIPASE, AMYLASE in the last 8760 hours. No results for input(s): AMMONIA in the last 8760 hours. CBC:  Recent Labs  02/17/15 1809 06/04/15 12/31/15  WBC 12.7* 7.1 8.8  HGB 14.1 13.2 14.6  HCT 41.9 40 45  MCV 88  --   --   PLT 169 179 222   TSH:  Recent Labs  12/31/15  TSH 2.26   A1C: Lab Results  Component Value Date   HGBA1C 11.5 12/31/2015   Lipid Panel:  Recent Labs  06/04/15 12/31/15  CHOL 116 83  HDL 39 28*  LDLCALC 65 36  TRIG 108 92   Vitamin D: 12-31-15     41.45   Assessment/Plan 1. Type 2 diabetes mellitus with diabetic neuropathy, with long-term current use of insulin (HCC) -A1c elevated at 11.5, blood sugars fasting above 200s. Will increase lantus to 14 units at this time and start novolog 5 units with meals for CBGs over 150. Will dc saxagliptin at this time.   2. Hyperlipidemia LDL of 30, will DC statin for now and monitor  3. Acute blood loss anemia -noted to be anemia after hip surgery, hgb  stable at 14.6  4. Vitamin D deficiency Vit D level stable at 41. Will cont vit D 50,000 units monthly    Dustyn Armbrister K. Harle Battiest  [facility] [phone] 8 am - 5 pm) [phone] (after hours)

## 2016-01-03 ENCOUNTER — Non-Acute Institutional Stay (SKILLED_NURSING_FACILITY): Payer: Medicare Other | Admitting: Family

## 2016-01-03 DIAGNOSIS — R059 Cough, unspecified: Secondary | ICD-10-CM | POA: Insufficient documentation

## 2016-01-03 DIAGNOSIS — R05 Cough: Secondary | ICD-10-CM

## 2016-01-03 NOTE — Progress Notes (Signed)
Patient ID: Christine Summers, female   DOB: [birth year], 82 y.o.   MRN: [id number]  Location: [facility]  Provider:  Blanchie Serve, MD   No primary care provider on file.  Code Status:Full Code  Goals of care: Advanced Directive information Advanced Directives 01/01/2016  Does patient have an advance directive? No  Would patient like information on creating an advanced directive? No - patient declined information     Chief Complaint  Patient presents with  . Acute Visit    cough     HPI:  Pt is a 82 y.o. female seen today at [facility] non-productive cough. She is here for long term care rehabilitation. She has a past medical history of Dementia, Type 2 DM, hyperlipidemia, Vitamin D  Deficiency, COPD, Anemia, CKD among others.She is seen in her room today. Facility staff reports patient has had a non-productive cough. She has was given Guaifenesin syrup without any relief. She has been afebrile. CBCG's stable with recent Novolog ordered. She denies any chills, fever or fatigue though history is limited due to dementia.   Review of Systems  Constitutional: Negative for fever, chills and malaise/fatigue.  HENT: Negative.   Respiratory: Negative for shortness of breath and wheezing.        Non-Productive cough   Cardiovascular: Negative.   Gastrointestinal: Negative.   Genitourinary: Negative.   Musculoskeletal: Negative.   Skin: Negative.   Neurological: Negative.   Psychiatric/Behavioral: Negative.     No past medical history on file. No past surgical history on file.  Allergies  Allergen Reactions  . Codeine       Medication List       This list is accurate as of: 01/03/16 11:58 AM.  Always use your most recent med list.               acetaminophen 325 MG tablet  Commonly known as:  TYLENOL  Take 650 mg by mouth every 6 (six) hours as needed (pain).     aspirin EC 81 MG tablet  Take 81 mg by mouth daily.     atorvastatin 10 MG tablet  Commonly known as:  LIPITOR  Take 10 mg by mouth at bedtime.     Cholecalciferol 1000 units tablet  Take 1,000 Units by mouth daily.     Cholecalciferol 50000 units capsule  Take 1 capsule by mouth in the morning every 30 days     conjugated estrogens vaginal cream  Commonly known as:  PREMARIN  Place 1 Applicatorful vaginally at bedtime. Insert 0.5 grams vaginally at bedtime     docusate sodium 100 MG capsule  Commonly known as:  COLACE  Take 100 mg by mouth 2 (two) times daily.     donepezil 10 MG tablet  Commonly known as:  ARICEPT  Take 10 mg by mouth at bedtime.     insulin aspart 100 UNIT/ML injection  Commonly known as:  novoLOG  Inject 5 Units into the skin 3 (three) times daily before meals.     insulin glargine 100 UNIT/ML injection  Commonly known as:  LANTUS  Inject 14 Units into the skin daily.     lamoTRIgine 25 MG tablet  Commonly known as:  LAMICTAL  Take 25 mg by mouth 2 (two) times daily. Take 2 tablets= 50 mg by mouth twice daily     Melatonin 3 MG Tabs  Take 3 mg by mouth at bedtime.     nystatin cream  Commonly known as:  MYCOSTATIN  Apply 1 application topically as needed for dry skin. Nystatin 100,000 units/GM Cream. Apply topically to groin as needed for yeast     oxybutynin 5 MG tablet  Commonly known as:  DITROPAN  Take 1/2 tablet= 2.5 mg by mouth twice a day     polyethylene glycol packet  Commonly known as:  MIRALAX / GLYCOLAX  Take 17 g by mouth daily.     senna 8.6 MG tablet  Commonly known as:  SENOKOT  Take 1 tablet by mouth at bedtime.         There is no immunization history on file for this patient. Pertinent  Health Maintenance Due  Topic Date Due  . FOOT EXAM  06/11/1935  . OPHTHALMOLOGY EXAM  06/11/1937  . URINE MICROALBUMIN  06/11/1937  . DEXA SCAN  06/11/1992  . PNA vac Low Risk Adult (1 of 2 - PCV13) 06/11/1992  . INFLUENZA VACCINE  06/17/2015  . HEMOGLOBIN A1C  06/29/2016   No  flowsheet data found.  Filed Vitals:   01/03/16 1131  BP: 113/72  Pulse: 98  Temp: 97.9 F (36.6 C)  Resp: 20  Weight: 174 lb (78.926 kg)  SpO2: 95%   There is no height on file to calculate BMI. Physical Exam  Constitutional: She appears well-developed and well-nourished. No distress.  HENT:  Head: Normocephalic.  Mouth/Throat: Oropharynx is clear and moist.  Eyes: EOM are normal. Pupils are equal, round, and reactive to light. Right eye exhibits no discharge. Left eye exhibits no discharge. No scleral icterus.  Neck: Normal range of motion. No JVD present. No thyromegaly present.  Cardiovascular: Normal rate, regular rhythm, normal heart sounds and intact distal pulses.   Pulmonary/Chest: Effort normal. No respiratory distress. She has no wheezes. She exhibits no tenderness.  Rales to Left mid lung lobe noted.   Abdominal: Soft. Bowel sounds are normal. She exhibits no distension and no mass. There is no tenderness. There is no rebound and no guarding.  Musculoskeletal: Normal range of motion. She exhibits no edema or tenderness.  Lymphadenopathy:    She has no cervical adenopathy.  Neurological: She is alert.  Skin: Skin is warm and dry. No rash noted. No erythema. No pallor.  Psychiatric: She has a normal mood and affect.  Confused at baseline.     Labs reviewed:  Recent Labs  02/17/15 1809 06/04/15 12/31/15  NA 134* 136* 134*  K 4.6 4.3 4.2  CL 99*  --   --   CO2 27  --   --   GLUCOSE 209*  --   --   BUN 23* 22* 13  CREATININE 1.26* 1.1 1.0  CALCIUM 9.1  --   --     Recent Labs  02/17/15 1809 06/04/15 12/31/15  AST 23 20 16   ALT 18 16 11   ALKPHOS 85 72 115  BILITOT 0.9  --   --   PROT 6.7  --   --   ALBUMIN 3.5  --   --     Recent Labs  02/17/15 1809 06/04/15 12/31/15  WBC 12.7* 7.1 8.8  HGB 14.1 13.2 14.6  HCT 41.9 40 45  MCV 88  --   --   PLT 169 179 222   Lab Results  Component Value Date   TSH 2.26 12/31/2015   Lab Results  Component  Value Date   HGBA1C 11.5 12/31/2015   Lab Results  Component Value Date   CHOL 83 12/31/2015  HDL 28* 12/31/2015   LDLCALC 36 12/31/2015   TRIG 92 12/31/2015    Significant Diagnostic Results in last 30 days:  No results found.  Assessment/Plan Cough Afebrile. Non-productive. No SOB or wheezing. Rales to left mid lung lobe. Will order a stat CXR PA/Lat. Continue Cough Syrup. V/S Q 8 Hrs X 5 days.     Family/ staff Communication: Reviewed plan of Care with Facility Nurse Supervisor.   Labs/tests ordered:  CBC/diff, CMP 01/06/2016

## 2016-01-06 LAB — BASIC METABOLIC PANEL
BUN: 10 mg/dL (ref 4–21)
Creatinine: 0.9 mg/dL (ref 0.5–1.1)
Glucose: 159 mg/dL
Potassium: 4.5 mmol/L (ref 3.4–5.3)
Sodium: 135 mmol/L — AB (ref 137–147)

## 2016-01-06 LAB — HEPATIC FUNCTION PANEL
ALT: 14 U/L (ref 7–35)
AST: 23 U/L (ref 13–35)
Alkaline Phosphatase: 97 U/L (ref 25–125)
BILIRUBIN, TOTAL: 0.7 mg/dL

## 2016-01-06 LAB — CBC AND DIFFERENTIAL
HEMATOCRIT: 48 % — AB (ref 36–46)
HEMOGLOBIN: 15.3 g/dL (ref 12.0–16.0)
PLATELETS: 222 10*3/uL (ref 150–399)
WBC: 6 10^3/mL

## 2016-01-09 NOTE — Progress Notes (Signed)
This encounter was created in error - please disregard.

## 2016-01-14 DIAGNOSIS — I69319 Unspecified symptoms and signs involving cognitive functions following cerebral infarction: Secondary | ICD-10-CM | POA: Insufficient documentation

## 2016-01-14 DIAGNOSIS — I129 Hypertensive chronic kidney disease with stage 1 through stage 4 chronic kidney disease, or unspecified chronic kidney disease: Secondary | ICD-10-CM | POA: Insufficient documentation

## 2016-01-14 DIAGNOSIS — D692 Other nonthrombocytopenic purpura: Secondary | ICD-10-CM | POA: Insufficient documentation

## 2016-01-14 DIAGNOSIS — E1122 Type 2 diabetes mellitus with diabetic chronic kidney disease: Secondary | ICD-10-CM | POA: Insufficient documentation

## 2016-01-14 DIAGNOSIS — F0280 Dementia in other diseases classified elsewhere without behavioral disturbance: Secondary | ICD-10-CM | POA: Insufficient documentation

## 2016-01-14 DIAGNOSIS — D638 Anemia in other chronic diseases classified elsewhere: Secondary | ICD-10-CM | POA: Insufficient documentation

## 2016-01-14 DIAGNOSIS — N183 Chronic kidney disease, stage 3 unspecified: Secondary | ICD-10-CM | POA: Insufficient documentation

## 2016-01-14 DIAGNOSIS — R32 Unspecified urinary incontinence: Secondary | ICD-10-CM | POA: Insufficient documentation

## 2016-01-14 DIAGNOSIS — G309 Alzheimer's disease, unspecified: Secondary | ICD-10-CM

## 2016-01-14 DIAGNOSIS — N952 Postmenopausal atrophic vaginitis: Secondary | ICD-10-CM | POA: Insufficient documentation

## 2016-01-29 ENCOUNTER — Encounter: Payer: Self-pay | Admitting: Internal Medicine

## 2016-01-29 ENCOUNTER — Non-Acute Institutional Stay (SKILLED_NURSING_FACILITY): Payer: Medicare Other | Admitting: Internal Medicine

## 2016-01-29 DIAGNOSIS — F0280 Dementia in other diseases classified elsewhere without behavioral disturbance: Secondary | ICD-10-CM

## 2016-01-29 DIAGNOSIS — E559 Vitamin D deficiency, unspecified: Secondary | ICD-10-CM

## 2016-01-29 DIAGNOSIS — E1165 Type 2 diabetes mellitus with hyperglycemia: Secondary | ICD-10-CM | POA: Diagnosis not present

## 2016-01-29 DIAGNOSIS — N183 Chronic kidney disease, stage 3 (moderate): Secondary | ICD-10-CM

## 2016-01-29 DIAGNOSIS — G309 Alzheimer's disease, unspecified: Secondary | ICD-10-CM | POA: Diagnosis not present

## 2016-01-29 DIAGNOSIS — E669 Obesity, unspecified: Secondary | ICD-10-CM | POA: Diagnosis not present

## 2016-01-29 DIAGNOSIS — E1122 Type 2 diabetes mellitus with diabetic chronic kidney disease: Secondary | ICD-10-CM

## 2016-01-29 DIAGNOSIS — E785 Hyperlipidemia, unspecified: Secondary | ICD-10-CM | POA: Diagnosis not present

## 2016-01-29 DIAGNOSIS — E1159 Type 2 diabetes mellitus with other circulatory complications: Secondary | ICD-10-CM | POA: Insufficient documentation

## 2016-01-29 NOTE — Progress Notes (Signed)
Patient ID: Christine Summers, female   DOB: 07-04-1925, 82 y.o.   MRN: [id number]    LOCATION: [facility]  PCP: No primary care provider on file.   Code Status: Full Code  Goals of care: Advanced Directive information Advanced Directives 01/01/2016  Does patient have an advance directive? No  Would patient like information on creating an advanced directive? No - patient declined information     Extended Emergency Contact Information Primary Emergency Contact: Duggins,Rebecca S Address: [address]          [place], [place] [address] [place] Phone: [phone] Work Phone: [phone] Relation: None Secondary Emergency Contact: Duggin,Sue Address: [address]          [place], [place] [address] Johnnette Litter of [place] Phone: [phone] Work Phone: [phone] Mobile Phone: [phone] Relation: Daughter   Allergies  Allergen Reactions  . Codeine     Chief Complaint  Patient presents with  . Medical Management of Chronic Issues    Routine Visit     HPI:  Patient is a 82 y.o. female seen today for routine visit. She is pleasantly confused and denies any concern this visit. She has been treated for URI and has shown clinical improvement. No new concern from nursing staff.   Review of Systems:  Constitutional: Negative for fever, chills HENT: Negative for headache, nasal discharge, difficulty swallowing.   Eyes: Negative for blurred vision, double vision and discharge.  Respiratory: Negative for cough, shortness of breath and wheezing.   Cardiovascular: Negative for chest pain, palpitations.  Gastrointestinal: Negative for heartburn, nausea, vomiting, abdominal pain.  Genitourinary: Negative for dysuria Musculoskeletal: Negative for fall in this facility Skin: Negative for itching, rash.  Neurological: Negative for dizziness Psychiatric/Behavioral: Negative for depression  PMH- CKD 3, CVA, DM type 2, HLD, alzhemier's  disease   Medications:   Medication List       This list is accurate as of: 01/29/16  2:50 PM.  Always use your most recent med list.               acetaminophen 325 MG tablet  Commonly known as:  TYLENOL  Take 650 mg by mouth every 6 (six) hours as needed (pain).     aspirin EC 81 MG tablet  Take 81 mg by mouth daily.     Cholecalciferol 1000 units tablet  Take 1,000 Units by mouth daily.     Cholecalciferol 50000 units capsule  Take 1 capsule by mouth in the morning every 30 days     conjugated estrogens vaginal cream  Commonly known as:  PREMARIN  Place 1 Applicatorful vaginally at bedtime. Insert 0.5 grams vaginally at bedtime     docusate sodium 100 MG capsule  Commonly known as:  COLACE  Take 100 mg by mouth 2 (two) times daily.     donepezil 10 MG tablet  Commonly known as:  ARICEPT  Take 10 mg by mouth at bedtime.     insulin aspart 100 UNIT/ML injection  Commonly known as:  novoLOG  Inject 5 Units into the skin 3 (three) times daily before meals.     insulin glargine 100 UNIT/ML injection  Commonly known as:  LANTUS  Inject 14 Units into the skin at bedtime.     lamoTRIgine 25 MG tablet  Commonly known as:  LAMICTAL  Take 25 mg by mouth 2 (two) times daily. Take 2 tablets= 50 mg by mouth twice daily     Melatonin 3 MG  Tabs  Take 3 mg by mouth at bedtime.     nystatin cream  Commonly known as:  MYCOSTATIN  Apply 1 application topically as needed for dry skin. Nystatin 100,000 units/GM Cream. Apply topically to groin as needed for yeast     oxybutynin 5 MG tablet  Commonly known as:  DITROPAN  Take 1/2 tablet= 2.5 mg by mouth twice a day     polyethylene glycol packet  Commonly known as:  MIRALAX / GLYCOLAX  Take 17 g by mouth daily.     senna 8.6 MG tablet  Commonly known as:  SENOKOT  Take 1 tablet by mouth at bedtime.        Immunizations: Immunization History  Administered Date(s) Administered  . PPD Test 01/06/2016, 01/20/2016      Physical Exam:  Filed Vitals:   01/29/16 1415  BP: 136/98  Pulse: 71  Temp: 97.3 F (36.3 C)  TempSrc: Oral  Resp: 20  Height: 5' (1.524 m)  Weight: 163 lb 3.2 oz (74.027 kg)  SpO2: 94%   Body mass index is 31.87 kg/(m^2).   General- elderly female, obese, in no acute distress Head- normocephalic, atraumatic Nose- no maxillary or frontal sinus tenderness, no nasal discharge Throat- moist mucus membrane, adentulous Eyes- PERRLA, EOMI, no pallor, no icterus, no discharge, normal conjunctiva, normal sclera Neck- no cervical lymphadenopathy Cardiovascular- normal s1,s2, no murmur Respiratory- CTAB Abdomen- bowel sounds present, soft, non tender Musculoskeletal- able to move all 4 extremities, generalized weakness, on wheelchair Neurological- alert and oriented to person and place Skin- warm and dry, senile purpura + Psychiatry- normal mood, poor insight    Labs reviewed: Basic Metabolic Panel:  Recent Labs  02/17/15 1809 06/04/15 12/31/15 01/06/16  NA 134* 136* 134* 135*  K 4.6 4.3 4.2 4.5  CL 99*  --   --   --   CO2 27  --   --   --   GLUCOSE 209*  --   --   --   BUN 23* 22* 13 10  CREATININE 1.26* 1.1 1.0 0.9  CALCIUM 9.1  --   --   --    Liver Function Tests:  Recent Labs  02/17/15 1809 06/04/15 12/31/15 01/06/16  AST 23 20 16 23   ALT 18 16 11 14   ALKPHOS 85 72 115 97  BILITOT 0.9  --   --   --   PROT 6.7  --   --   --   ALBUMIN 3.5  --   --   --    No results for input(s): LIPASE, AMYLASE in the last 8760 hours. No results for input(s): AMMONIA in the last 8760 hours. CBC:  Recent Labs  02/17/15 1809 06/04/15 12/31/15 01/06/16  WBC 12.7* 7.1 8.8 6.0  HGB 14.1 13.2 14.6 15.3  HCT 41.9 40 45 48*  MCV 88  --   --   --   PLT 169 179 222 222   Lab Results  Component Value Date   HGBA1C 11.5 12/31/2015   Lipid Panel     Component Value Date/Time   CHOL 83 12/31/2015   TRIG 92 12/31/2015   HDL 28* 12/31/2015   LDLCALC 36 12/31/2015     Assessment/Plan  Obesity Will need portion control and calorie counting. Monitor weight  Vitamin d def Reviewed vitamin d level of 41.45. Continue vitamin d and change this to 2000 u daily. discontinue weekly vitamin d supplement  Alzheimer's disease Chronic, stable. Continue aricept and monitor. Continue assistance  with her ADLs, fall precautions, pressure ulcer prophylaxis.   Type 2 dm poorly controlled with renal disease a1c suggestive of poorly controlled DM. cbg readings mostly in 250s. Off statin with LDL at goal. Check urine microalbumin to creatinine ration. Continue lantus but increase to 18 u for now with SSI novolog with meals.   HLD LDL at goal. Off statin for now. monitor   Family/ staff Communication: reviewed care plan with patient and nursing supervisor    Blanchie Serve, MD Internal Medicine [place], [place] [address] Cell Phone (Monday-Friday 8 am - 5 pm): [phone] On Call: [phone] and follow prompts after 5 pm and on weekends Office Phone: [phone] Office Fax: [phone]

## 2016-01-30 LAB — MICROALBUMIN, URINE: Microalb, Ur: 17.4

## 2016-02-20 ENCOUNTER — Non-Acute Institutional Stay (SKILLED_NURSING_FACILITY): Payer: Medicare Other | Admitting: Internal Medicine

## 2016-02-20 ENCOUNTER — Encounter: Payer: Self-pay | Admitting: Internal Medicine

## 2016-02-20 DIAGNOSIS — K5900 Constipation, unspecified: Secondary | ICD-10-CM

## 2016-02-20 DIAGNOSIS — C44629 Squamous cell carcinoma of skin of left upper limb, including shoulder: Secondary | ICD-10-CM | POA: Diagnosis not present

## 2016-02-20 DIAGNOSIS — R21 Rash and other nonspecific skin eruption: Secondary | ICD-10-CM | POA: Diagnosis not present

## 2016-02-20 DIAGNOSIS — N3946 Mixed incontinence: Secondary | ICD-10-CM

## 2016-02-20 DIAGNOSIS — G309 Alzheimer's disease, unspecified: Secondary | ICD-10-CM

## 2016-02-20 DIAGNOSIS — K5909 Other constipation: Secondary | ICD-10-CM

## 2016-02-20 DIAGNOSIS — F0280 Dementia in other diseases classified elsewhere without behavioral disturbance: Secondary | ICD-10-CM

## 2016-02-20 NOTE — Progress Notes (Signed)
Patient ID: Christine Summers, female   DOB: [birth year], 82 y.o.   MRN: [id number]    LOCATION: [facility]  PCP: No primary care provider on file.   Code Status: Full Code  Goals of care: Advanced Directive information Advanced Directives 01/01/2016  Does patient have an advance directive? No  Would patient like information on creating an advanced directive? No - patient declined information     Extended Emergency Contact Information Primary Emergency Contact: Duggins,Rebecca S Address: [address]          [place], [place] [address] [place] Phone: [phone] Work Phone: [phone] Relation: None Secondary Emergency Contact: Duggin,Sue Address: [address]          [place], [place] [address] Johnnet[place] Litter of [place] Phone: [phone] Work Phone: [phone] Mobile Phone: [phone] Relation: Daughter   Allergies  Allergen Reactions  . Codeine     Chief Complaint  Patient presents with  . Medical Management of Chronic Issues    Routine Visit     HPI:  Patient is a 82 y.o. female seen today for routine visit. She is seen in her room today. She denies any concern. No new concern from staff. She participates in group activities. She is s/p Moh's surgery for SCC to lesion on dorsum of her left hand. cbg readings have improved.    Review of Systems:  Constitutional: Negative for fever, chills HENT: Negative for headache, difficulty swallowing.   Eyes: Negative for blurred vision, double vision and discharge.  Respiratory: Negative for cough, shortness of breath and wheezing.   Cardiovascular: Negative for chest pain, palpitations.  Gastrointestinal: Negative for heartburn, nausea, vomiting, abdominal pain.  Genitourinary: Negative for dysuria Musculoskeletal: Negative for fall in this facility Skin: Negative for itching, rash.  Neurological: Negative for dizziness Psychiatric/Behavioral: Negative for depression  PMH- CKD 3, CVA, DM type  2, HLD, alzhemier's disease   Medications: Medication reviewed. See MAR   Immunizations: Immunization History  Administered Date(s) Administered  . PPD Test 01/06/2016, 01/20/2016     Physical Exam:  Filed Vitals:   02/20/16 1548  BP: 137/77  Pulse: 85  Temp: 98 F (36.7 C)  TempSrc: Oral  Resp: 20  Height: 5' (1.524 m)  Weight: 163 lb 6.4 oz (74.118 kg)   Body mass index is 31.91 kg/(m^2).   General- elderly female, obese, in no acute distress Head- normocephalic, atraumatic Nose- no nasal discharge Throat- moist mucus membrane, adentulous Eyes- PERRLA, EOMI, no pallor, no icterus, no discharge, normal conjunctiva, normal sclera Neck- no cervical lymphadenopathy Cardiovascular- normal s1,s2, no murmur Respiratory- CTAB Abdomen- bowel sounds present, soft, non tender Musculoskeletal- able to move all 4 extremities, generalized weakness, on wheelchair Neurological- alert and oriented to person and place Skin- warm and dry, senile purpura + Psychiatry- normal mood, poor insight    Labs reviewed: Basic Metabolic Panel:  Recent Labs  06/04/15 12/31/15 01/06/16  NA 136* 134* 135*  K 4.3 4.2 4.5  BUN 22* 13 10  CREATININE 1.1 1.0 0.9   Liver Function Tests:  Recent Labs  06/04/15 12/31/15 01/06/16  AST 20 16 23   ALT 16 11 14   ALKPHOS 72 115 97   No results for input(s): LIPASE, AMYLASE in the last 8760 hours. No results for input(s): AMMONIA in the last 8760 hours. CBC:  Recent Labs  06/04/15 12/31/15 01/06/16  WBC 7.1 8.8 6.0  HGB 13.2 14.6 15.3  HCT 40 45 48*  PLT 179 222 222  Lab Results  Component Value Date   HGBA1C 11.5 12/31/2015   Lipid Panel     Component Value Date/Time   CHOL 83 12/31/2015   TRIG 92 12/31/2015   HDL 28* 12/31/2015   LDLCALC 36 12/31/2015    Assessment/Plan  UI Continue ditropan, continue perineal care  Squamous cell carcinoma S/p Moh's surgery and no new lesion noted this visit, monitor  Alzheimer's  disease Chronic, stable. Continue aricept and monitor. Continue assistance with her ADLs, fall precautions, pressure ulcer prophylaxis.   Chronic constipation Stable, continue colace, senokot and miralax for now and monitor  Groin fungal rash Continue nystatin cream and perineal care   Blanchie Serve, MD Internal Medicine [facility] [place], [place] [address] Cell Phone (Monday-Friday 8 am - 5 pm): [phone] On Call: [phone] and follow prompts after 5 pm and on weekends Office Phone: [phone] Office Fax: [phone]

## 2016-03-18 ENCOUNTER — Encounter: Payer: Self-pay | Admitting: Internal Medicine

## 2016-03-18 ENCOUNTER — Non-Acute Institutional Stay (SKILLED_NURSING_FACILITY): Payer: Medicare Other | Admitting: Internal Medicine

## 2016-03-18 DIAGNOSIS — N183 Chronic kidney disease, stage 3 (moderate): Secondary | ICD-10-CM | POA: Diagnosis not present

## 2016-03-18 DIAGNOSIS — E1129 Type 2 diabetes mellitus with other diabetic kidney complication: Secondary | ICD-10-CM

## 2016-03-18 DIAGNOSIS — G4700 Insomnia, unspecified: Secondary | ICD-10-CM | POA: Diagnosis not present

## 2016-03-18 DIAGNOSIS — F329 Major depressive disorder, single episode, unspecified: Secondary | ICD-10-CM | POA: Diagnosis not present

## 2016-03-18 DIAGNOSIS — R809 Proteinuria, unspecified: Secondary | ICD-10-CM

## 2016-03-18 DIAGNOSIS — E1122 Type 2 diabetes mellitus with diabetic chronic kidney disease: Secondary | ICD-10-CM | POA: Diagnosis not present

## 2016-03-18 NOTE — Progress Notes (Signed)
Patient ID: Christine Summers, female   DOB: 25-Jun-1925, 82 y.o.   MRN: [id number]    LOCATION: [facility]  PCP: No primary care provider on file.   Code Status: Full Code  Goals of care: Advanced Directive information Advanced Directives 01/01/2016  Does patient have an advance directive? No  Would patient like information on creating an advanced directive? No - patient declined information     Extended Emergency Contact Information Primary Emergency Contact: Duggins,Rebecca S Address: [address]          [place], [place] [address] [place] Phone: [phone] Work Phone: [phone] Relation: None Secondary Emergency Contact: Duggin,Sue Address: [address]          [place], [place] [address] Johnnette Litter of [place] Phone: [phone] Work Phone: [phone] Mobile Phone: [phone] Relation: Daughter   Allergies  Allergen Reactions  . Codeine     Chief Complaint  Patient presents with  . Medical Management of Chronic Issues    Routine Visit     HPI:  Patient is a 82 y.o. female seen today for routine visit. She is seen in her room today. She denies any concern. No new concern from staff.    Review of Systems:  Constitutional: Negative for fever   Respiratory: Negative for cough, shortness of breath and wheezing.   Cardiovascular: Negative for chest pain, palpitations.  Gastrointestinal: Negative for heartburn, nausea, vomiting, abdominal pain.  Genitourinary: Negative for dysuria Musculoskeletal: Negative for fall in this facility Skin: Negative for itching, rash.  Neurological: Negative for dizziness Psychiatric/Behavioral: Negative for depression  PMH- CKD 3, CVA, DM type 2, HLD, alzhemier's disease   Medications: Medication reviewed. See MAR   Immunizations: Immunization History  Administered Date(s) Administered  . PPD Test 01/06/2016, 01/20/2016     Physical Exam:  Filed Vitals:   03/18/16 1402  BP: 115/67    Pulse: 84  Temp: 97.6 F (36.4 C)  TempSrc: Oral  Resp: 20  Height: 5\' 9"  (1.753 m)  Weight: 166 lb 12.8 oz (75.66 kg)  SpO2: 95%    Wt Readings from Last 3 Encounters:  03/18/16 166 lb 12.8 oz (75.66 kg)  02/20/16 163 lb 6.4 oz (74.118 kg)  01/29/16 163 lb 3.2 oz (74.027 kg)    General- elderly female, in no acute distress Head- normocephalic, atraumatic Throat- moist mucus membrane, adentulous Eyes- PERRLA, EOMI, no pallor, no icterus, no discharge, normal conjunctiva, normal sclera Neck- no cervical lymphadenopathy Cardiovascular- normal s1,s2, no murmur Respiratory- CTAB Abdomen- bowel sounds present, soft, non tender Musculoskeletal- able to move all 4 extremities, generalized weakness, on wheelchair Neurological- alert and oriented to person and place Skin- warm and dry, senile purpura + Psychiatry- normal mood, poor insight    Labs reviewed: Basic Metabolic Panel:  Recent Labs  06/04/15 12/31/15 01/06/16  NA 136* 134* 135*  K 4.3 4.2 4.5  BUN 22* 13 10  CREATININE 1.1 1.0 0.9   Liver Function Tests:  Recent Labs  06/04/15 12/31/15 01/06/16  AST 20 16 23   ALT 16 11 14   ALKPHOS 72 115 97   No results for input(s): LIPASE, AMYLASE in the last 8760 hours. No results for input(s): AMMONIA in the last 8760 hours. CBC:  Recent Labs  06/04/15 12/31/15 01/06/16  WBC 7.1 8.8 6.0  HGB 13.2 14.6 15.3  HCT 40 45 48*  PLT 179 222 222   Lab Results  Component Value Date   HGBA1C 11.5 12/31/2015   Lipid Panel  Component Value Date/Time   CHOL 83 12/31/2015   TRIG 92 12/31/2015   HDL 28* 12/31/2015   LDLCALC 36 12/31/2015   Microalbuminuria + on 3/17   Assessment/Plan  Type 2 dm with ckd Lab Results  Component Value Date   HGBA1C 11.5 12/31/2015  recheck a1c now. Continue lantus 18 u daily and SSI novolog with meals. Monitor cbg. Has urine microalbuminuria but not on ACEI/ARB with her ckd stage3  Chronic depression Continue lamictal 50 mg  bid  Insomnia Continue melatonin and monitor  Microalbuminuria present with dm Noted on urine test. Not on ACEI/ ARB with ckd stage 3   Blanchie Serve, MD Internal Medicine [facility] [place], [place] [address] Cell Phone (Monday-Friday 8 am - 5 pm): [phone] On Call: [phone] and follow prompts after 5 pm and on weekends Office Phone: [phone] Office Fax: [phone]

## 2016-03-19 LAB — CBC AND DIFFERENTIAL
HCT: 46 % (ref 36–46)
HEMOGLOBIN: 14.8 g/dL (ref 12.0–16.0)
PLATELETS: 198 10*3/uL (ref 150–399)
WBC: 6.4 10*3/mL

## 2016-03-19 LAB — HEPATIC FUNCTION PANEL
ALT: 9 U/L (ref 7–35)
AST: 14 U/L (ref 13–35)
Alkaline Phosphatase: 85 U/L (ref 25–125)
Bilirubin, Total: 0.4 mg/dL

## 2016-03-19 LAB — BASIC METABOLIC PANEL
BUN: 22 mg/dL — AB (ref 4–21)
Creatinine: 1 mg/dL (ref 0.5–1.1)
GLUCOSE: 125 mg/dL
POTASSIUM: 4.5 mmol/L (ref 3.4–5.3)
Sodium: 139 mmol/L (ref 137–147)

## 2016-03-19 LAB — HEMOGLOBIN A1C: HEMOGLOBIN A1C: 8.1

## 2016-04-20 ENCOUNTER — Encounter: Payer: Self-pay | Admitting: Internal Medicine

## 2016-04-20 ENCOUNTER — Non-Acute Institutional Stay (SKILLED_NURSING_FACILITY): Payer: Medicare Other | Admitting: Internal Medicine

## 2016-04-20 DIAGNOSIS — F0280 Dementia in other diseases classified elsewhere without behavioral disturbance: Secondary | ICD-10-CM

## 2016-04-20 DIAGNOSIS — R809 Proteinuria, unspecified: Secondary | ICD-10-CM | POA: Diagnosis not present

## 2016-04-20 DIAGNOSIS — N3281 Overactive bladder: Secondary | ICD-10-CM | POA: Diagnosis not present

## 2016-04-20 DIAGNOSIS — G309 Alzheimer's disease, unspecified: Secondary | ICD-10-CM | POA: Diagnosis not present

## 2016-04-20 DIAGNOSIS — E1129 Type 2 diabetes mellitus with other diabetic kidney complication: Secondary | ICD-10-CM

## 2016-04-20 NOTE — Progress Notes (Signed)
Patient ID: Christine Summers, female   DOB: [birth year], 82 y.o.   MRN: [id number]    [facility]ATION[facility]  PCP: No primary care provider on file.   Code Status: Full Code  Goals of care: Advanced Directive information Advanced Directives 01/01/2016  Does patient have an advance directive? No  Would patient like information on creating an advanced directive? No - patient declined information     Extended Emergency Contact Information Primary Emergency Contact: Duggins,Rebecca S Address: [address]          [place], [place] [address] [place] Phone: [phone] Work Phone: [phone] Relation: None Secondary Emergency Contact: Duggin,Sue Address: [address]          [place],  [address] Johnnette Litter of [place] Phone: [phone] Work Phone: [phone] Mobile Phone: [phone] Relation: Daughter   Allergies  Allergen Reactions  . Codeine     Chief Complaint  Patient presents with  . Medical Management of Chronic Issues    Routine Visit     HPI:  Patient is a 82 y.o. female seen today for routine visit. She is seen in her room today. She denies any concern. No new concern from staff. No fall reported.   Review of Systems:  Constitutional: Negative for fever   Respiratory: Negative for cough, shortness of breath and wheezing.   Cardiovascular: Negative for chest pain, palpitations.  Gastrointestinal: Negative for heartburn, nausea, vomiting, abdominal pain.  Genitourinary: Negative for dysuria Musculoskeletal: Negative for fall in this facility Skin: Negative for itching, rash.  Neurological: Negative for dizziness Psychiatric/Behavioral: Negative for depression  PMH- CKD 3, CVA, DM type 2, HLD, alzhemier's disease     Medication List       This list is accurate as of: 04/20/16  4:54 PM.  Always use your most recent med list.               acetaminophen 325 MG tablet  Commonly known as:  TYLENOL  Take 650 mg by mouth  every 6 (six) hours as needed (pain).     aspirin EC 81 MG tablet  Take 81 mg by mouth daily.     conjugated estrogens vaginal cream  Commonly known as:  PREMARIN  Place 1 Applicatorful vaginally at bedtime. Insert 0.5 grams vaginally at bedtime     docusate sodium 100 MG capsule  Commonly known as:  COLACE  Take 100 mg by mouth 2 (two) times daily.     donepezil 10 MG tablet  Commonly known as:  ARICEPT  Take 10 mg by mouth at bedtime.     insulin aspart 100 UNIT/ML injection  Commonly known as:  novoLOG  Inject 5 Units into the skin 3 (three) times daily before meals.     insulin glargine 100 UNIT/ML injection  Commonly known as:  LANTUS  Inject 18 Units into the skin at bedtime.     lamoTRIgine 25 MG tablet  Commonly known as:  LAMICTAL  Take 25 mg by mouth 2 (two) times daily. Take 2 tablets= 50 mg by mouth twice daily     lisinopril 2.5 MG tablet  Commonly known as:  PRINIVIL,ZESTRIL  Take 2.5 mg by mouth daily.     Melatonin 3 MG Tabs  Take 3 mg by mouth at bedtime.     nystatin cream  Commonly known as:  MYCOSTATIN  Apply 1 application topically daily as needed for dry skin. Nystatin 100,000 units/GM Cream. Apply topically to groin as needed  for yeast     oxybutynin 5 MG tablet  Commonly known as:  DITROPAN  Take 1/2 tablet= 2.5 mg by mouth twice a day     polyethylene glycol packet  Commonly known as:  MIRALAX / GLYCOLAX  Take 17 g by mouth daily.     senna 8.6 MG tablet  Commonly known as:  SENOKOT  Take 1 tablet by mouth at bedtime.     Vitamin D3 2000 units Tabs  Take 2,000 Units by mouth daily.        Immunizations: Immunization History  Administered Date(s) Administered  . PPD Test 01/06/2016, 01/20/2016     Physical Exam:  Filed Vitals:   04/20/16 1517  BP: 106/55  Pulse: 82  Temp: 97.6 F (36.4 C)  TempSrc: Oral  Resp: 19  Height: 5\' 9"  (1.753 m)  Weight: 170 lb 3.2 oz (77.202 kg)  SpO2: 96%  Body mass index is 25.12  kg/(m^2).   Wt Readings from Last 3 Encounters:  04/20/16 170 lb 3.2 oz (77.202 kg)  03/18/16 166 lb 12.8 oz (75.66 kg)  02/20/16 163 lb 6.4 oz (74.118 kg)    General- elderly female, in no acute distress Head- normocephalic, atraumatic Throat- moist mucus membrane, adentulous Eyes- PERRLA, EOMI, no pallor, no icterus, no discharge, normal conjunctiva, normal sclera Neck- no cervical lymphadenopathy Cardiovascular- normal s1,s2, no murmur Respiratory- CTAB Abdomen- bowel sounds present, soft, non tender Musculoskeletal- able to move all 4 extremities, generalized weakness more to lower extremities, on wheelchair Neurological- alert and oriented to person and place Skin- warm and dry, senile purpura + Psychiatry- normal mood, poor insight    Labs reviewed: Basic Metabolic Panel:  Recent Labs  12/31/15 01/06/16 03/19/16  NA 134* 135* 139  K 4.2 4.5 4.5  BUN 13 10 22*  CREATININE 1.0 0.9 1.0   Liver Function Tests:  Recent Labs  12/31/15 01/06/16 03/19/16  AST 16 23 14   ALT 11 14 9   ALKPHOS 115 97 85   No results for input(s): LIPASE, AMYLASE in the last 8760 hours. No results for input(s): AMMONIA in the last 8760 hours. CBC:  Recent Labs  12/31/15 01/06/16 03/19/16  WBC 8.8 6.0 6.4  HGB 14.6 15.3 14.8  HCT 45 48* 46  PLT 222 222 198   Lab Results  Component Value Date   HGBA1C 8.1 03/19/2016   Lipid Panel     Component Value Date/Time   CHOL 83 12/31/2015   TRIG 92 12/31/2015   HDL 28* 12/31/2015   LDLCALC 36 12/31/2015   Microalbuminuria + on 3/17   Assessment/Plan  Microalbuminuria present with dm Noted on urine test. Has been started on lisinopril 2.5 mg daily  OAB Continue ditropan  Dementia Stable, decline anticipated. Continue aricept and aspirin    Blanchie Serve, MD Internal Medicine [facility] [address] [place], [place] [address] Cell Phone (Monday-Friday 8 am - 5 pm): [phone] On  Call: [phone] and follow prompts after 5 pm and on weekends Office Phone: [phone] Office Fax: [phone]

## 2016-04-29 IMAGING — CR DG FEMUR 2V*L*
1 series · 4 of 4 positions shown · non-contrast
Comparison: None.

CLINICAL DATA: Fall, left femur pain

EXAM:
LEFT FEMUR - 2 VIEW

[Series 1: dxr femur left · 0.14mm/px · 4 of 4 slices shown]
[im 1/4]
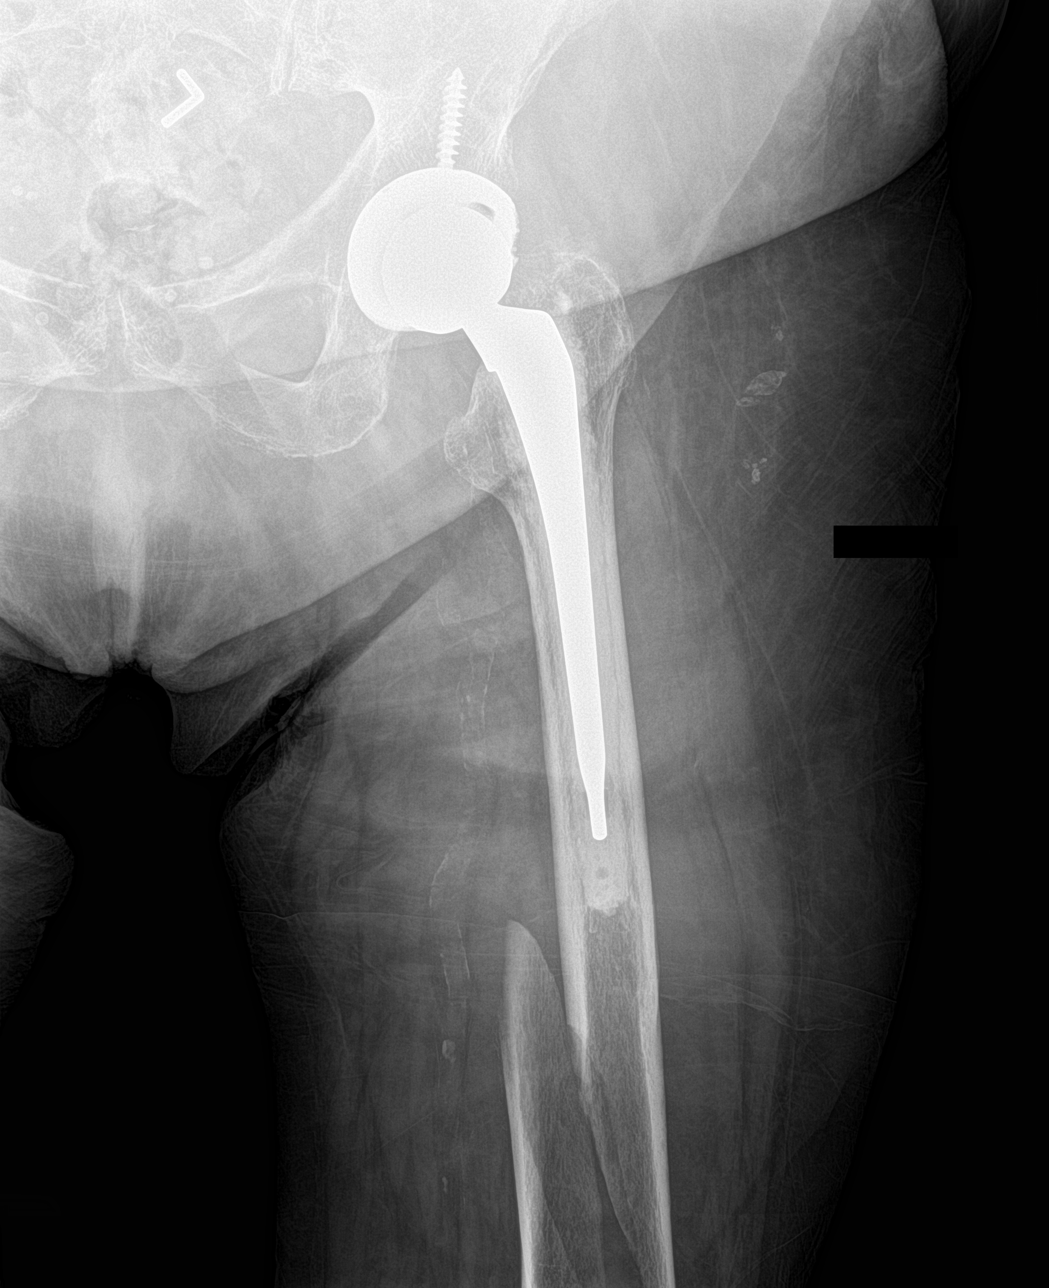
[im 2/4]
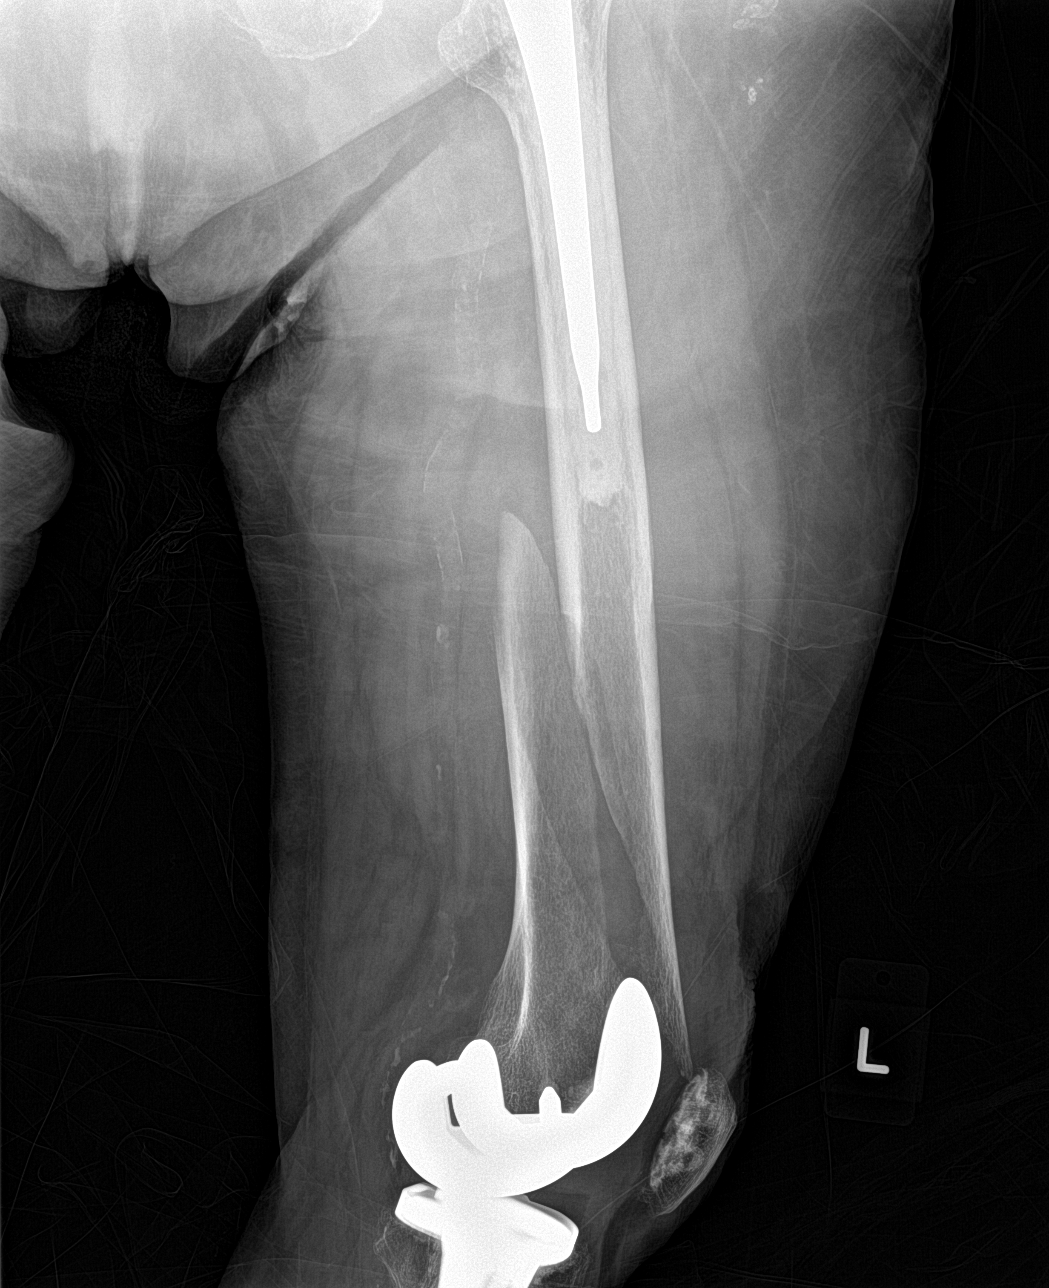
[im 3/4]
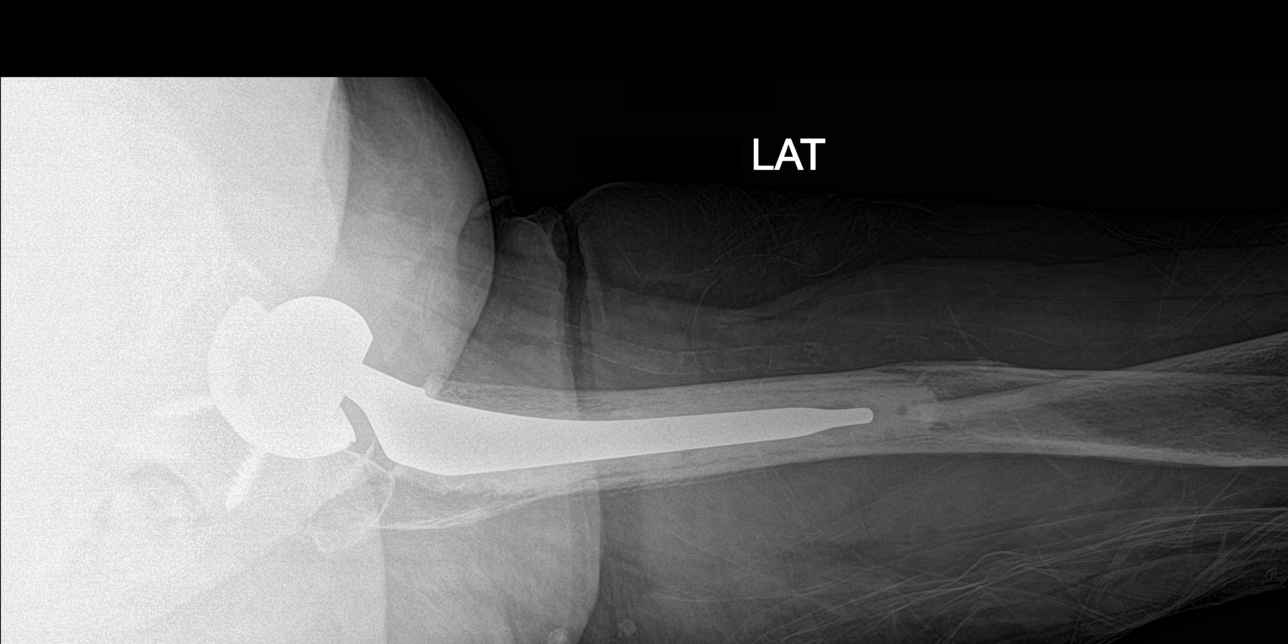
[im 4/4]
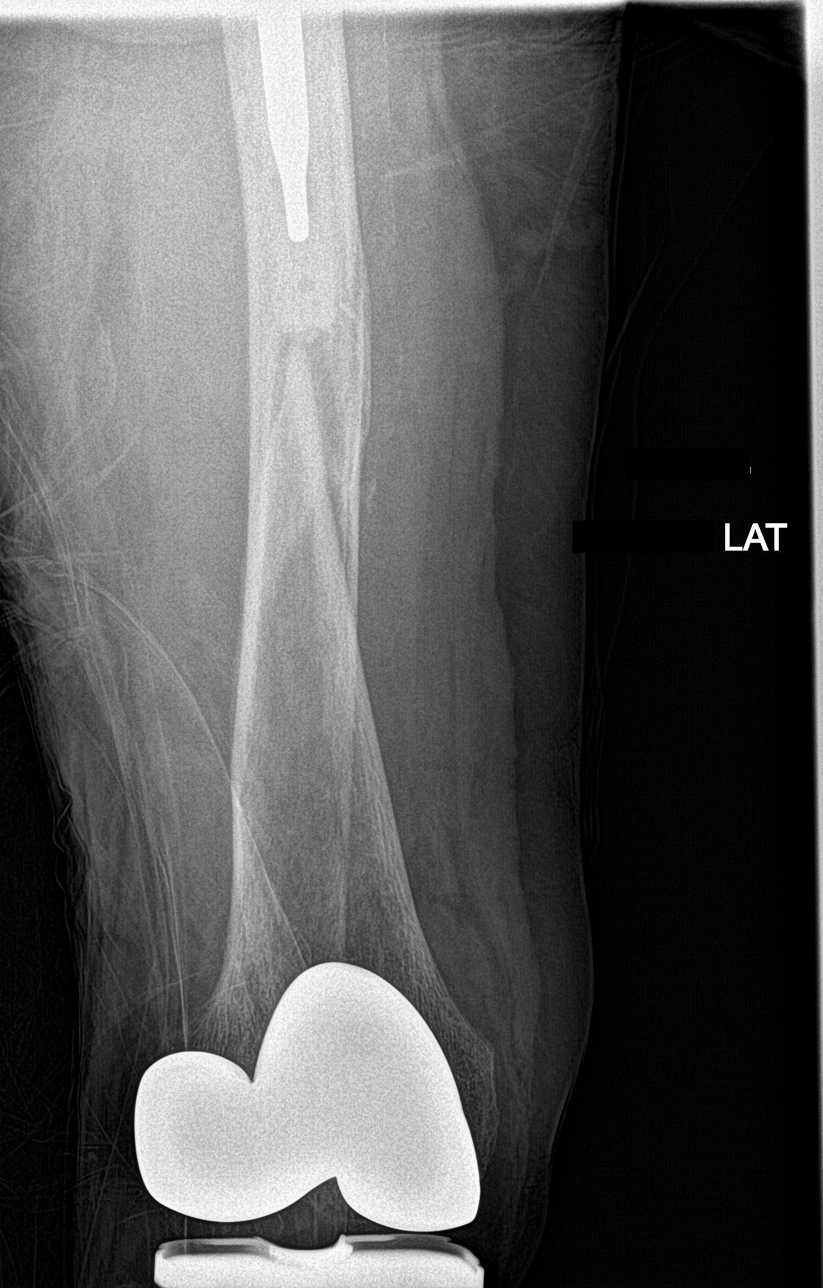

[4 of 4 positions shown; findings below may reference images not displayed]

FINDINGS: Left total hip arthroplasty is noted. There is an oblique spiral
fracture of the distal left femur extending to the tibial component
of left total knee arthroplasty. One full shaft width overlap of the
fracture fragments is identified. Vascular calcifications are noted.
IMPRESSION: Oblique spiral fracture of the distal left femoral diaphysis/meta
diaphysis.

## 2016-04-29 IMAGING — CR PELVIS - 1-2 VIEW
1 series · 1 of 1 positions shown · non-contrast
Comparison: None.

CLINICAL DATA: Fall today with left femur pain.

EXAM:
PELVIS - 1-2 VIEW

[dxr pelvis ap only]
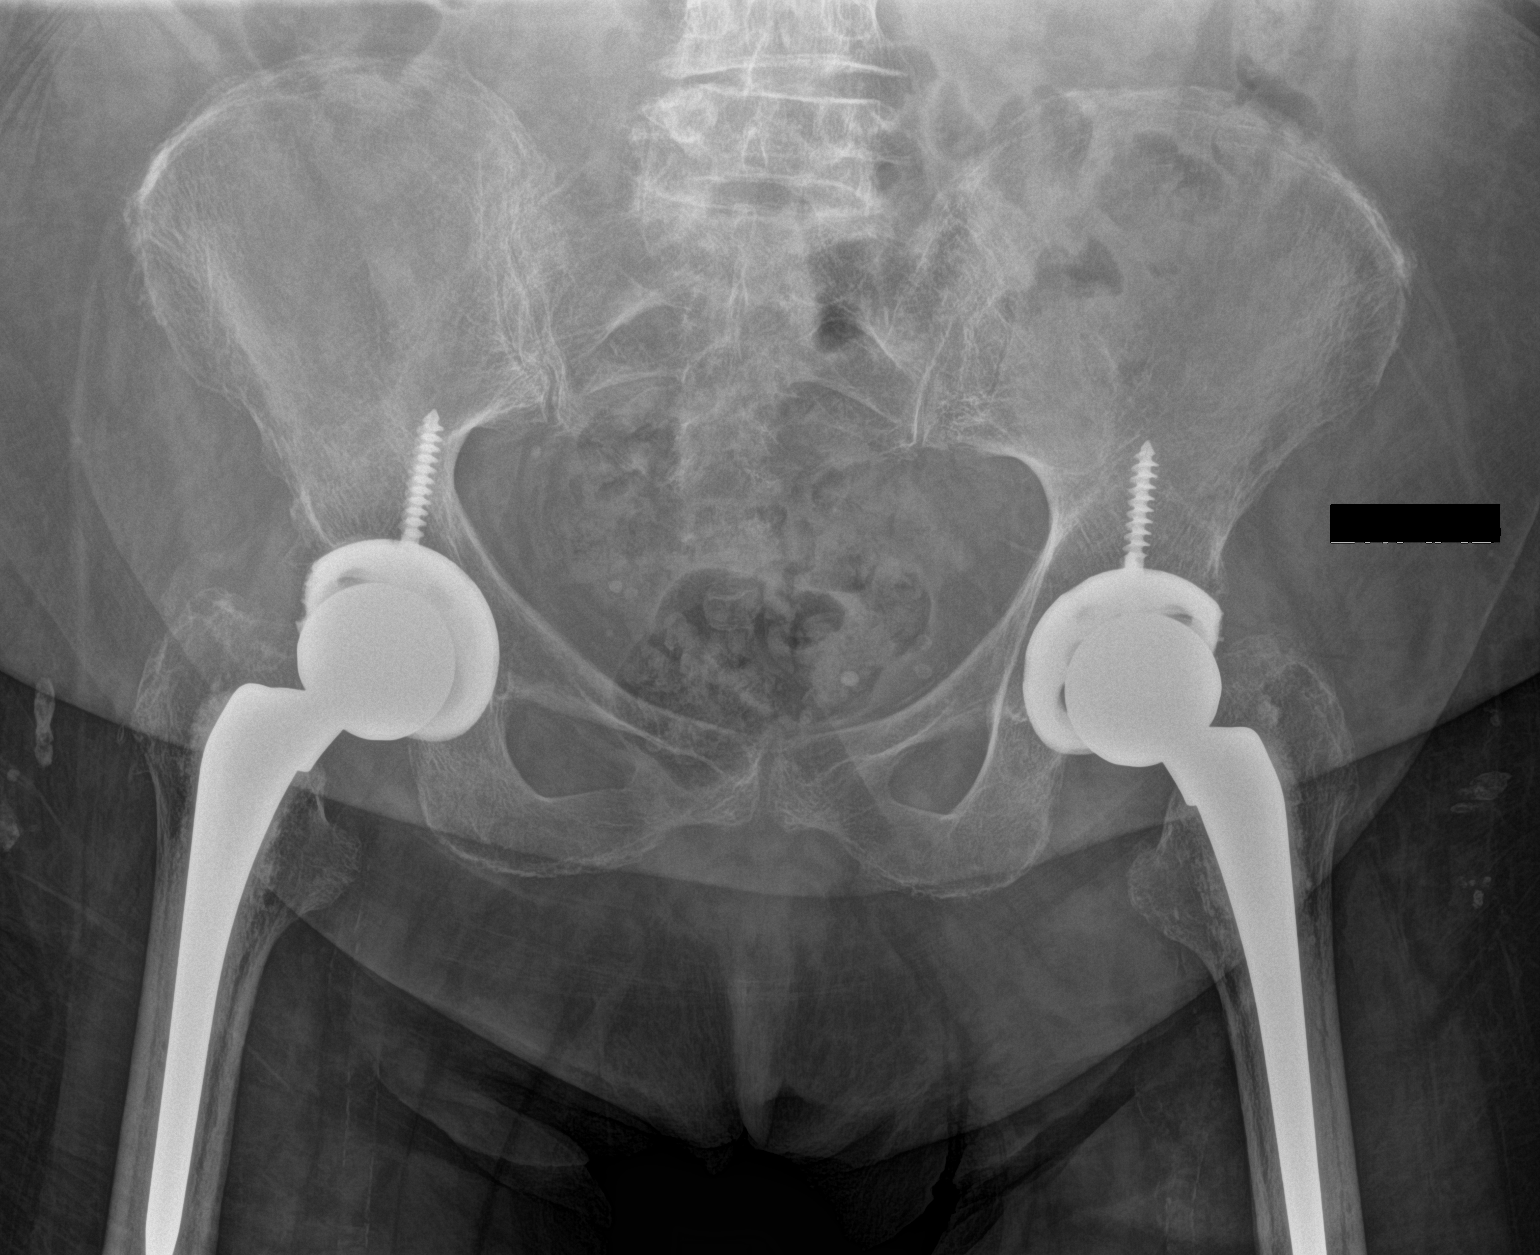

[1 of 1 positions shown; findings below may reference images not displayed]

FINDINGS: Bilateral total hip arthroplasty. The femoral stems are partially
visible, but on the symptomatic side there is dedicated imaging of
the femur. The prostheses are located. There is no visible
periprosthetic fracture. The pelvic ring is intact. Marked
osteopenia.
IMPRESSION: 1.  No acute findings in the pelvis.
2. Bilateral total hip arthroplasty.

## 2016-04-29 IMAGING — CR DG CHEST 1V
1 series · 1 of 1 positions shown · non-contrast
Comparison: 11/26/2013

CLINICAL DATA: Left femur fracture after a fall. Preoperative
assessment.

EXAM:
CHEST  1 VIEW

[dxr chest 1 viewap or pa]
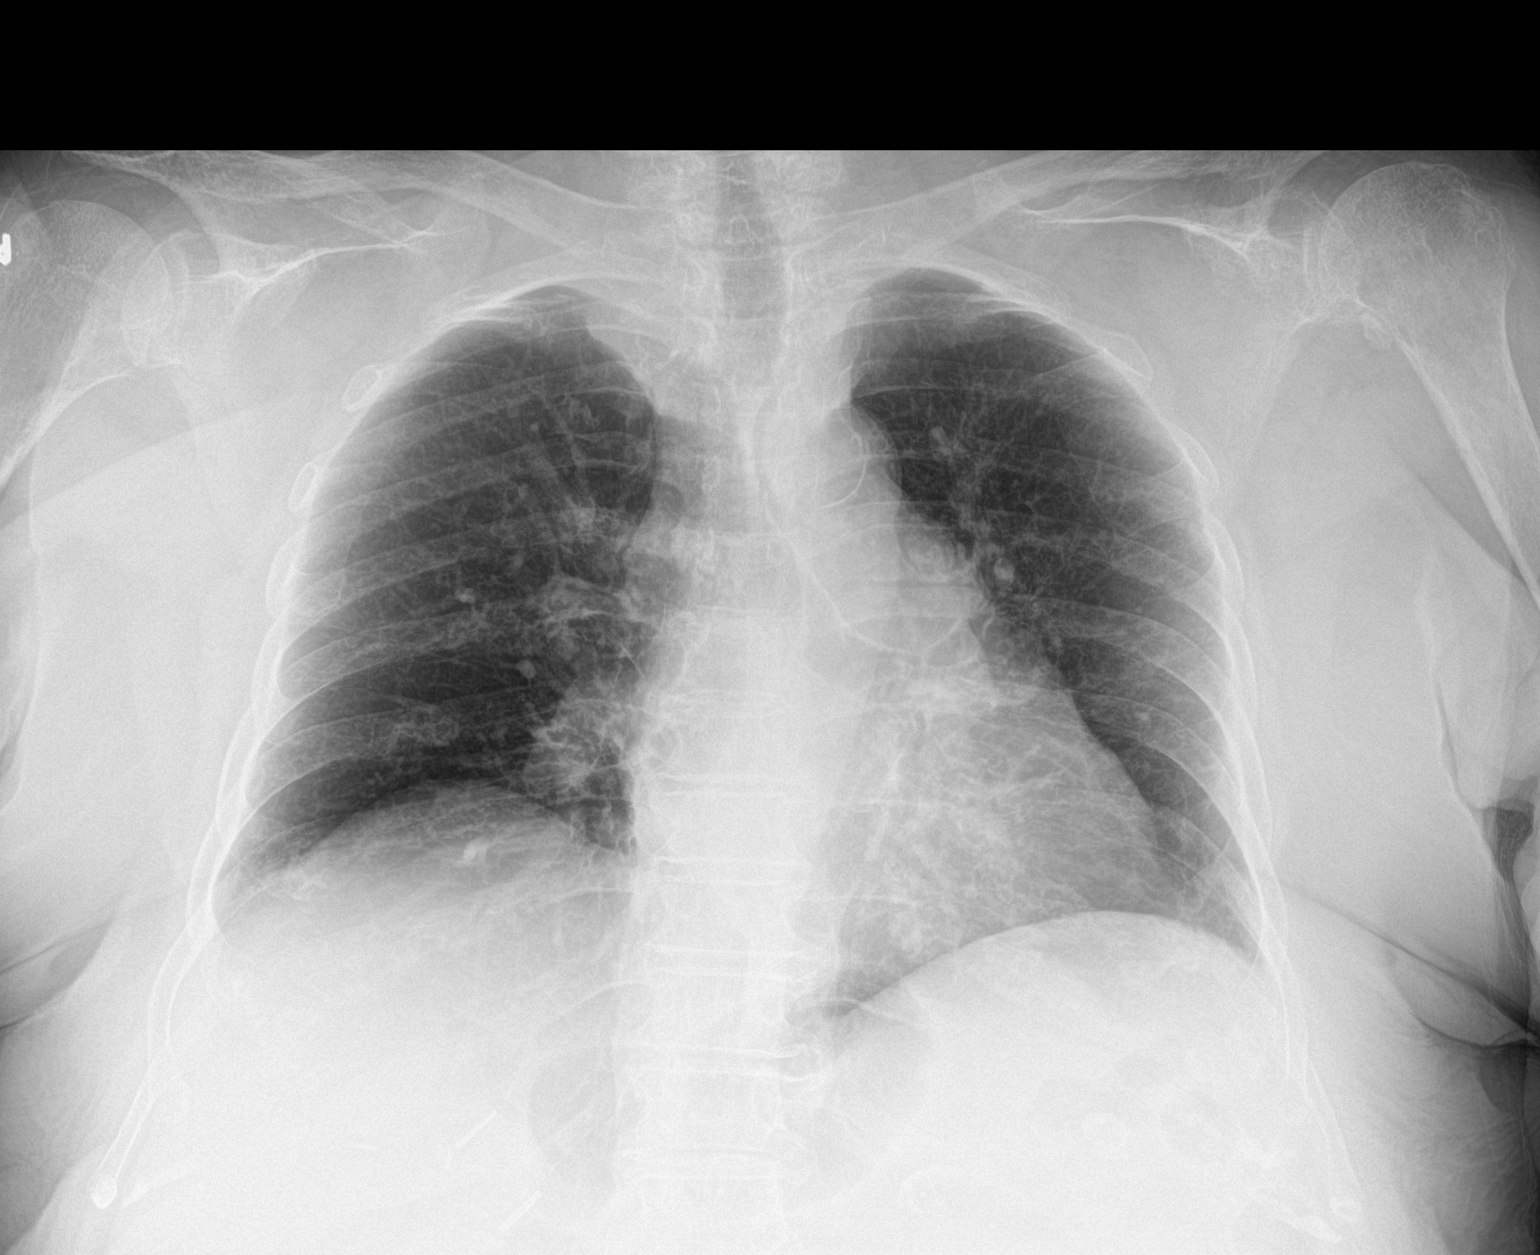

[1 of 1 positions shown; findings below may reference images not displayed]

FINDINGS: Atherosclerotic aortic arch.  Apical lordotic projection

Bony demineralization. Degenerative glenohumeral spurring on the
left.

The lungs appear clear.  No pleural effusion.
IMPRESSION: 1. Atherosclerotic aorta. Bony demineralization. No acute thoracic
findings.

## 2016-05-25 ENCOUNTER — Non-Acute Institutional Stay (SKILLED_NURSING_FACILITY): Payer: Medicare Other | Admitting: Internal Medicine

## 2016-05-25 ENCOUNTER — Encounter: Payer: Self-pay | Admitting: Internal Medicine

## 2016-05-25 DIAGNOSIS — E1122 Type 2 diabetes mellitus with diabetic chronic kidney disease: Secondary | ICD-10-CM

## 2016-05-25 DIAGNOSIS — N183 Chronic kidney disease, stage 3 (moderate): Secondary | ICD-10-CM | POA: Diagnosis not present

## 2016-05-25 DIAGNOSIS — Z8673 Personal history of transient ischemic attack (TIA), and cerebral infarction without residual deficits: Secondary | ICD-10-CM | POA: Diagnosis not present

## 2016-05-25 DIAGNOSIS — F329 Major depressive disorder, single episode, unspecified: Secondary | ICD-10-CM

## 2016-05-25 DIAGNOSIS — F0150 Vascular dementia without behavioral disturbance: Secondary | ICD-10-CM

## 2016-05-25 DIAGNOSIS — Z794 Long term (current) use of insulin: Secondary | ICD-10-CM

## 2016-05-25 DIAGNOSIS — F0151 Vascular dementia with behavioral disturbance: Secondary | ICD-10-CM | POA: Diagnosis not present

## 2016-05-25 DIAGNOSIS — F0153 Vascular dementia, unspecified severity, with mood disturbance: Secondary | ICD-10-CM

## 2016-05-25 NOTE — Progress Notes (Signed)
Patient ID: Christine Summers, female   DOB: 14-Apr-1925, 81 y.o.   MRN: [id number]    [facility]  PCP: No primary care provider on file.   Code Status: Full Code  Goals of care: Advanced Directive information Advanced Directives 01/01/2016  Does patient have an advance directive? No  Would patient like information on creating an advanced directive? No - patient declined information     Extended Emergency Contact Information Primary Emergency Contact: Summers,Christine S Address: [address]          [name], [name] [address] [place] Phone: [phone] Work Phone: [phone] Relation: None Secondary Emergency Contact: Summers,Christine Address: [address]          [place], [place] [address] Johnnette Litter of [place] Phone: [phone] Work Phone: [phone] Mobile Phone: [phone] Relation: Daughter   Allergies  Allergen Reactions  . Codeine     Chief Complaint  Patient presents with  . Medical Management of Chronic Issues    Routine Visit     HPI:  Patient is a 82 y.o. female seen today for routine visit. She has been at her baseline with no new concerns.   Review of Systems:  Constitutional: Negative for fever   Respiratory: Negative for cough, shortness of breath and wheezing.   Cardiovascular: Negative for chest pain, palpitations.  Gastrointestinal: Negative for heartburn, nausea, vomiting, abdominal pain.  Genitourinary: Negative for dysuria Musculoskeletal: Negative for fall in this facility Skin: Negative for itching, rash.  Neurological: Negative for dizziness Psychiatric/Behavioral: Negative for depression  PMH- CKD 3, CVA, DM type 2, HLD, alzhemier's disease     Medication List       This list is accurate as of: 05/25/16  2:06 PM.  Always use your most recent med list.               acetaminophen 325 MG tablet  Commonly known as:  TYLENOL  Take 650 mg by mouth every 6 (six) hours as needed (pain).     aspirin EC 81 MG tablet  Take 81 mg by mouth daily.     conjugated estrogens vaginal cream  Commonly known as:  PREMARIN  Place 1 Applicatorful vaginally at bedtime. Insert 0.5 grams vaginally at bedtime     docusate sodium 100 MG capsule  Commonly known as:  COLACE  Take 100 mg by mouth 2 (two) times daily.     donepezil 10 MG tablet  Commonly known as:  ARICEPT  Take 10 mg by mouth at bedtime.     insulin aspart 100 UNIT/ML injection  Commonly known as:  novoLOG  Inject 5 Units into the skin 3 (three) times daily before meals.     insulin glargine 100 UNIT/ML injection  Commonly known as:  LANTUS  Inject 18 Units into the skin at bedtime.     lamoTRIgine 25 MG tablet  Commonly known as:  LAMICTAL  Take 25 mg by mouth 2 (two) times daily. Take 2 tablets= 50 mg by mouth twice daily     lisinopril 2.5 MG tablet  Commonly known as:  PRINIVIL,ZESTRIL  Take 2.5 mg by mouth daily.     Melatonin 3 MG Tabs  Take 3 mg by mouth at bedtime.     nystatin cream  Commonly known as:  MYCOSTATIN  Apply 1 application topically daily as needed for dry skin. Nystatin 100,000 units/GM Cream. Apply topically to groin as needed for yeast     oxybutynin 5 MG tablet  Commonly known as:  DITROPAN  Take 1/2 tablet= 2.5 mg by mouth twice a day     polyethylene glycol packet  Commonly known as:  MIRALAX / GLYCOLAX  Take 17 g by mouth daily.     senna 8.6 MG tablet  Commonly known as:  SENOKOT  Take 1 tablet by mouth at bedtime.     Vitamin D3 2000 units Tabs  Take 2,000 Units by mouth daily.        Immunizations: Immunization History  Administered Date(s) Administered  . PPD Test 01/06/2016, 01/20/2016     Physical Exam:  Filed Vitals:   05/25/16 1359  BP: 110/60  Pulse: 72  Temp: 97.6 F (36.4 C)  TempSrc: Oral  Resp: 20  Height: 5\' 9"  (1.753 m)  Weight: 166 lb 12.8 oz (75.66 kg)  Body mass index is 24.62 kg/(m^2).   Wt Readings from Last 3 Encounters:  05/25/16  166 lb 12.8 oz (75.66 kg)  04/20/16 170 lb 3.2 oz (77.202 kg)  03/18/16 166 lb 12.8 oz (75.66 kg)    General- elderly female, in no acute distress Head- normocephalic, atraumatic Throat- moist mucus membrane, edentulous Eyes- PERRLA, EOMI, no pallor, no icterus, no discharge, normal conjunctiva, normal sclera Neck- no cervical lymphadenopathy Cardiovascular- normal s1,s2, no murmur Respiratory- CTAB Abdomen- bowel sounds present, soft, non tender Musculoskeletal- able to move all 4 extremities, generalized weakness, on wheelchair Neurological- alert and oriented to person and place Skin- warm and dry, senile purpura + Psychiatry- normal mood, poor insight    Labs reviewed: Basic Metabolic Panel:  Recent Labs  12/31/15 01/06/16 03/19/16  NA 134* 135* 139  K 4.2 4.5 4.5  BUN 13 10 22*  CREATININE 1.0 0.9 1.0   Liver Function Tests:  Recent Labs  12/31/15 01/06/16 03/19/16  AST 16 23 14   ALT 11 14 9   ALKPHOS 115 97 85   No results for input(s): LIPASE, AMYLASE in the last 8760 hours. No results for input(s): AMMONIA in the last 8760 hours. CBC:  Recent Labs  12/31/15 01/06/16 03/19/16  WBC 8.8 6.0 6.4  HGB 14.6 15.3 14.8  HCT 45 48* 46  PLT 222 222 198   Lab Results  Component Value Date   HGBA1C 8.1 03/19/2016   Lipid Panel     Component Value Date/Time   CHOL 83 12/31/2015   TRIG 92 12/31/2015   HDL 28* 12/31/2015   LDLCALC 36 12/31/2015   Microalbuminuria + on 3/17   Assessment/Plan  DM type 2 with ckd stage 3 Lab Results  Component Value Date   HGBA1C 8.1 03/19/2016   Monitor cbg. Continue lantus 18 u daily with novolog 5 u tid only for cbg > 150 with meals and monitor. 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 Stable, decline anticipated. Continue aricept and aspirin. Continue supportive care. Continue lamictal to help with her mood.   History of CVA Stable, continue aspirin and monitor  BP    Blanchie Serve, MD Internal Medicine [facility] [address] [place], [place] [address] Cell Phone (Monday-Friday 8 am - 5 pm): [phone] On Call: [phone] and follow prompts after 5 pm and on weekends Office Phone: [phone] Office Fax: [phone]

## 2016-06-24 ENCOUNTER — Non-Acute Institutional Stay (SKILLED_NURSING_FACILITY): Payer: Medicare Other | Admitting: Internal Medicine

## 2016-06-24 ENCOUNTER — Encounter: Payer: Self-pay | Admitting: Internal Medicine

## 2016-06-24 DIAGNOSIS — F0150 Vascular dementia without behavioral disturbance: Secondary | ICD-10-CM

## 2016-06-24 DIAGNOSIS — G309 Alzheimer's disease, unspecified: Secondary | ICD-10-CM | POA: Diagnosis not present

## 2016-06-24 DIAGNOSIS — F0280 Dementia in other diseases classified elsewhere without behavioral disturbance: Secondary | ICD-10-CM | POA: Diagnosis not present

## 2016-06-24 DIAGNOSIS — I129 Hypertensive chronic kidney disease with stage 1 through stage 4 chronic kidney disease, or unspecified chronic kidney disease: Secondary | ICD-10-CM | POA: Diagnosis not present

## 2016-06-24 DIAGNOSIS — R3981 Functional urinary incontinence: Secondary | ICD-10-CM | POA: Diagnosis not present

## 2016-06-24 DIAGNOSIS — F329 Major depressive disorder, single episode, unspecified: Secondary | ICD-10-CM

## 2016-06-24 DIAGNOSIS — E1159 Type 2 diabetes mellitus with other circulatory complications: Secondary | ICD-10-CM | POA: Diagnosis not present

## 2016-06-24 NOTE — Progress Notes (Signed)
Patient ID: Christine Summers, female   DOB: 1925/04/08, 82 y.o.   MRN: [id number]    LOCATION: [facility], [facility]   Code Status: DNR   Extended Emergency Contact Information Primary Emergency Contact: Duggins,Rebecca S Address: [address]          [place], [place] [address] [place] of [place] Phone: [phone] Work Phone: [phone] Relation: None Secondary Emergency Contact: Duggin,Sue Address: [address]          [place],  [address] Johnnet[place]itter of [place] Phone: [phone] Work Phone: [phone] Mobile Phone: [phone] Relation: Daughter   Allergies  Allergen Reactions  . Codeine     Chief Complaint  Patient presents with  . Medical Management of Chronic Issues    Routine Visit     HPI:  Patient is a 82 y.o. female seen today for routine visit. She has been at her baseline with no new concerns. No new concern from nursing staff  Review of Systems:  Constitutional: Negative for fever   Respiratory: Negative for shortness of breath and wheezing. Positive for occasional cough with white phlegm  Cardiovascular: Negative for chest pain, palpitations.  Gastrointestinal: Negative for heartburn, nausea, vomiting, abdominal pain.  Genitourinary: Negative for dysuria Musculoskeletal: Negative for fall in this facility Skin: Negative for itching, rash.  Neurological: Negative for dizziness Psychiatric/Behavioral: Negative for depression  PMH- CKD 3, CVA, DM type 2, HLD, alzhemier's disease     Medication List       Accurate as of 06/24/16  1:34 PM. Always use your most recent med list.          acetaminophen 325 MG tablet Commonly known as:  TYLENOL Take 650 mg by mouth every 6 (six) hours as needed (pain).   aspirin EC 81 MG tablet Take 81 mg by mouth daily.   conjugated estrogens vaginal cream Commonly known as:  PREMARIN Place 1 Applicatorful vaginally at bedtime. Insert 0.5 grams vaginally at bedtime     docusate sodium 100 MG capsule Commonly known as:  COLACE Take 100 mg by mouth 2 (two) times daily.   donepezil 10 MG tablet Commonly known as:  ARICEPT Take 10 mg by mouth at bedtime.   insulin aspart 100 UNIT/ML injection Commonly known as:  novoLOG Inject 5 Units into the skin 3 (three) times daily before meals.   insulin glargine 100 UNIT/ML injection Commonly known as:  LANTUS Inject 18 Units into the skin at bedtime.   lamoTRIgine 25 MG tablet Commonly known as:  LAMICTAL Take 25 mg by mouth 2 (two) times daily. Take 2 tablets= 50 mg by mouth twice daily   lisinopril 2.5 MG tablet Commonly known as:  PRINIVIL,ZESTRIL Take 2.5 mg by mouth daily.   Melatonin 3 MG Tabs Take 3 mg by mouth at bedtime.   nystatin cream Commonly known as:  MYCOSTATIN Apply 1 application topically daily as needed for dry skin. Nystatin 100,000 units/GM Cream. Apply topically to groin as needed for yeast   oxybutynin 5 MG tablet Commonly known as:  DITROPAN Take 1/2 tablet= 2.5 mg by mouth twice a day   polyethylene glycol packet Commonly known as:  MIRALAX / GLYCOLAX Take 17 g by mouth daily.   senna 8.6 MG tablet Commonly known as:  SENOKOT Take 1 tablet by mouth at bedtime.   Vitamin D3 2000 units Tabs Take 2,000 Units by mouth daily.       Immunizations: Immunization History  Administered Date(s) Administered  .  PPD Test 01/06/2016, 01/20/2016     Physical Exam:  Vitals:   06/24/16 1107  BP: 136/74  Pulse: 81  Resp: 18  Temp: 97.6 F (36.4 C)  TempSrc: Oral  Weight: 168 lb 6.4 oz (76.4 kg)  Body mass index is 24.87 kg/m.   Wt Readings from Last 3 Encounters:  06/24/16 168 lb 6.4 oz (76.4 kg)  05/25/16 166 lb 12.8 oz (75.7 kg)  04/20/16 170 lb 3.2 oz (77.2 kg)    General- elderly female, obese, in no acute distress Head- normocephalic, atraumatic Throat- moist mucus membrane, edentulous Eyes- PERRLA, EOMI, no pallor, no icterus, no discharge, normal  conjunctiva, normal sclera Neck- no cervical lymphadenopathy Cardiovascular- normal s1,s2, no murmur Respiratory- CTAB Abdomen- bowel sounds present, soft, non tender Musculoskeletal- able to move all 4 extremities, generalized weakness, on wheelchair, needs 1 person assist with transfers, lower extremity weakness more prominent Neurological- alert and oriented to person and place Skin- warm and dry, senile purpura + Psychiatry- normal mood, poor insight    Labs reviewed: Basic Metabolic Panel:  Recent Labs  12/31/15 01/06/16 03/19/16  NA 134* 135* 139  K 4.2 4.5 4.5  BUN 13 10 22*  CREATININE 1.0 0.9 1.0   Liver Function Tests:  Recent Labs  12/31/15 01/06/16 03/19/16  AST 16 23 14   ALT 11 14 9   ALKPHOS 115 97 85   No results for input(s): LIPASE, AMYLASE in the last 8760 hours. No results for input(s): AMMONIA in the last 8760 hours. CBC:  Recent Labs  12/31/15 01/06/16 03/19/16  WBC 8.8 6.0 6.4  HGB 14.6 15.3 14.8  HCT 45 48* 46  PLT 222 222 198   Lab Results  Component Value Date   HGBA1C 8.1 03/19/2016   Lipid Panel     Component Value Date/Time   CHOL 83 12/31/2015   TRIG 92 12/31/2015   HDL 28 (A) 12/31/2015   LDLCALC 36 12/31/2015   Microalbuminuria + on 3/17   Assessment/Plan  DM type 2 with vascular complications Continue lantus for now with her humalog and to recheck a1c. Continue baby aspirin.   Chronic depression Stable mod, continue her lamictal above dosing. Monitor drug side effect and psych services to follow.   UI Continue ditropan 2.5 mg bid  Alzheimer's disease dementia and vascular dementia Continue aricept 10 mg daily and supportive care. Continue aspirin  Hypertensive renal disease Monitor bmp, continue lisinopril for now.   Labs- a1c, lipid panel   Blanchie Serve, MD Internal Medicine [facility] [address] [place], [place] [address] Cell Phone (Monday-Friday 8 am - 5 pm):  [phone] On Call: [phone] and follow prompts after 5 pm and on weekends Office Phone: [phone] Office Fax: [phone]

## 2016-06-25 LAB — LIPID PANEL
Cholesterol: 140 mg/dL (ref 0–200)
HDL: 36 mg/dL (ref 35–70)
LDL CALC: 85 mg/dL
Triglycerides: 96 mg/dL (ref 40–160)

## 2016-06-25 LAB — HEMOGLOBIN A1C: HEMOGLOBIN A1C: 7.1

## 2016-07-29 ENCOUNTER — Encounter: Payer: Self-pay | Admitting: Internal Medicine

## 2016-07-29 ENCOUNTER — Non-Acute Institutional Stay (SKILLED_NURSING_FACILITY): Payer: Medicare Other | Admitting: Internal Medicine

## 2016-07-29 DIAGNOSIS — Z794 Long term (current) use of insulin: Secondary | ICD-10-CM

## 2016-07-29 DIAGNOSIS — K5909 Other constipation: Secondary | ICD-10-CM | POA: Insufficient documentation

## 2016-07-29 DIAGNOSIS — E1129 Type 2 diabetes mellitus with other diabetic kidney complication: Secondary | ICD-10-CM

## 2016-07-29 DIAGNOSIS — K5900 Constipation, unspecified: Secondary | ICD-10-CM

## 2016-07-29 DIAGNOSIS — D638 Anemia in other chronic diseases classified elsewhere: Secondary | ICD-10-CM | POA: Diagnosis not present

## 2016-07-29 DIAGNOSIS — N952 Postmenopausal atrophic vaginitis: Secondary | ICD-10-CM

## 2016-07-29 DIAGNOSIS — E669 Obesity, unspecified: Secondary | ICD-10-CM

## 2016-07-29 NOTE — Progress Notes (Signed)
Patient ID: Christine Summers, female   DOB: 21-Nov-1924, 82 y.o.   MRN: [id number]    LOCATION: [facility], [place]   Code Status: DNR   Extended Emergency Contact Information Primary Emergency Contact: Duggins,Rebecca S Address: [address]          [place], [place] [address] Johnnette Litter of [place] Phone: [phone] Work Phone: [phone] Relation: None Secondary Emergency Contact: Duggin,Sue Address: [address]          [place], [place] [address] Johnnette Litter of [place] Phone: [phone] Work Phone: [phone] Mobile Phone: [phone] Relation: Daughter   Allergies  Allergen Reactions  . Codeine     Chief Complaint  Patient presents with  . Medical Management of Chronic Issues    Routine Visit     HPI:  Patient is a 82 y.o. female seen today for routine visit. She has been at her baseline with no new concerns. No new concern from nursing staff  Review of Systems:  Constitutional: Negative for fever   Respiratory: Negative for shortness of breath and cough Cardiovascular: Negative for chest pain, palpitation.  Gastrointestinal: Negative for heartburn, nausea, vomiting, abdominal pain.  Genitourinary: Negative for dysuria Musculoskeletal: Negative for fall in this facility Skin: Negative for itching, rash.  Neurological: Negative for dizziness Psychiatric/Behavioral: Negative for depression  PMH- CKD 3, CVA, DM type 2, HLD, alzhemier's disease     Medication List       Accurate as of 07/29/16  4:38 PM. Always use your most recent med list.          acetaminophen 325 MG tablet Commonly known as:  TYLENOL Take 650 mg by mouth every 6 (six) hours as needed (pain).   aspirin EC 81 MG tablet Take 81 mg by mouth daily.   conjugated estrogens vaginal cream Commonly known as:  PREMARIN Place 1 Applicatorful vaginally at bedtime. Insert 0.5 grams vaginally at bedtime   docusate sodium 100 MG capsule Commonly known as:   COLACE Take 100 mg by mouth 2 (two) times daily.   donepezil 10 MG tablet Commonly known as:  ARICEPT Take 10 mg by mouth at bedtime.   insulin aspart 100 UNIT/ML injection Commonly known as:  novoLOG Inject 5 Units into the skin 3 (three) times daily before meals.   insulin glargine 100 UNIT/ML injection Commonly known as:  LANTUS Inject 18 Units into the skin at bedtime.   lamoTRIgine 25 MG tablet Commonly known as:  LAMICTAL Take 25 mg by mouth 2 (two) times daily. Take 2 tablets= 50 mg by mouth twice daily   lisinopril 2.5 MG tablet Commonly known as:  PRINIVIL,ZESTRIL Take 2.5 mg by mouth daily.   Melatonin 3 MG Tabs Take 3 mg by mouth at bedtime.   nystatin cream Commonly known as:  MYCOSTATIN Apply 1 application topically daily as needed for dry skin. Nystatin 100,000 units/GM Cream. Apply topically to groin as needed for yeast   oxybutynin 5 MG tablet Commonly known as:  DITROPAN Take 1/2 tablet= 2.5 mg by mouth twice a day   polyethylene glycol packet Commonly known as:  MIRALAX / GLYCOLAX Take 17 g by mouth daily.   senna 8.6 MG tablet Commonly known as:  SENOKOT Take 1 tablet by mouth at bedtime.   Vitamin D3 2000 units Tabs Take 2,000 Units by mouth daily.       Immunizations: Immunization History  Administered Date(s) Administered  . PPD Test 01/06/2016, 01/20/2016     Physical  Exam:  Vitals:   07/29/16 1221  BP: 103/65  Pulse: 80  Temp: 97.7 F (36.5 C)  TempSrc: Oral  SpO2: 97%  Weight: 168 lb 6.4 oz (76.4 kg)  Height: 5' 0.25" (1.53 m)  Body mass index is 32.62 kg/m.   Wt Readings from Last 3 Encounters:  07/29/16 168 lb 6.4 oz (76.4 kg)  06/24/16 168 lb 6.4 oz (76.4 kg)  05/25/16 166 lb 12.8 oz (75.7 kg)    General- elderly female, obese, in no acute distress Head- normocephalic, atraumatic Throat- moist mucus membrane, edentulous Eyes- PERRLA, EOMI, no pallor, no icterus, no discharge, normal conjunctiva, normal  sclera Neck- no cervical lymphadenopathy Cardiovascular- normal s1,s2, no murmur Respiratory- CTAB Abdomen- bowel sounds present, soft, non tender Musculoskeletal- able to move all 4 extremities, generalized weakness, on wheelchair, needs 1 person assist with transfers, lower extremity weakness present Neurological- alert and oriented to person and place Skin- warm and dry, senile purpura + Psychiatry- normal mood, poor insight    Labs reviewed: Basic Metabolic Panel:  Recent Labs  12/31/15 01/06/16 03/19/16  NA 134* 135* 139  K 4.2 4.5 4.5  BUN 13 10 22*  CREATININE 1.0 0.9 1.0   Liver Function Tests:  Recent Labs  12/31/15 01/06/16 03/19/16  AST 16 23 14   ALT 11 14 9   ALKPHOS 115 97 85   No results for input(s): LIPASE, AMYLASE in the last 8760 hours. No results for input(s): AMMONIA in the last 8760 hours. CBC:  Recent Labs  12/31/15 01/06/16 03/19/16  WBC 8.8 6.0 6.4  HGB 14.6 15.3 14.8  HCT 45 48* 46  PLT 222 222 198   Lab Results  Component Value Date   HGBA1C 7.1 06/25/2016   Lipid Panel     Component Value Date/Time   CHOL 140 06/25/2016   TRIG 96 06/25/2016   HDL 36 06/25/2016   LDLCALC 85 06/25/2016   Microalbuminuria + on 3/17   Assessment/Plan  Anemia of chronic disease Sable H&h, monitor  Atrophic vaginitis Currently on premarin cream daily at bedtime, change this to 3 days a week and monitor  Chronic constipation Slow transit. Continue colace bid, senna at bedtime and daily miralax  DM type 2 with renal complications Improved [id number]. Monitor cbg. Continue lantus 18 u, SSI novolog for now. Continue lisinopril and aspirin. LDL at goal.   Obesity Lab Results  Component Value Date   TSH 2.26 12/31/2015   Lab Results  Component Value Date   HGBA1C 7.1 06/25/2016   RD consult for obesity to help assist with calorie count and portion control    Blanchie Serve, MD Internal Medicine [facility]  [facility] [place], [place] [address] Cell Phone (Monday-Friday 8 am - 5 pm): [phone] On Call: [phone] and follow prompts after 5 pm and on weekends Office Phone: [phone] Office Fax: [phone]

## 2016-08-20 ENCOUNTER — Non-Acute Institutional Stay (SKILLED_NURSING_FACILITY): Payer: Medicare Other | Admitting: Internal Medicine

## 2016-08-20 ENCOUNTER — Encounter: Payer: Self-pay | Admitting: Internal Medicine

## 2016-08-20 DIAGNOSIS — F0280 Dementia in other diseases classified elsewhere without behavioral disturbance: Secondary | ICD-10-CM | POA: Diagnosis not present

## 2016-08-20 DIAGNOSIS — N3281 Overactive bladder: Secondary | ICD-10-CM

## 2016-08-20 DIAGNOSIS — E7800 Pure hypercholesterolemia, unspecified: Secondary | ICD-10-CM

## 2016-08-20 DIAGNOSIS — G309 Alzheimer's disease, unspecified: Secondary | ICD-10-CM

## 2016-08-20 DIAGNOSIS — Z8673 Personal history of transient ischemic attack (TIA), and cerebral infarction without residual deficits: Secondary | ICD-10-CM | POA: Diagnosis not present

## 2016-08-20 DIAGNOSIS — F0150 Vascular dementia without behavioral disturbance: Secondary | ICD-10-CM

## 2016-08-20 DIAGNOSIS — G4700 Insomnia, unspecified: Secondary | ICD-10-CM

## 2016-08-20 NOTE — Progress Notes (Signed)
Patient ID: Christine Summers, female   DOB: October 11, 1925, 82 y.o.   MRN: [id number]    LOCATION: [facility], [place]   Code Status: DNR   Extended Emergency Contact Information Primary Emergency Contact: Duggins,Rebecca S Address: [address]          [place], [place] [address] [name] Phone: [phone] Work Phone: [phone] Relation: None Secondary Emergency Contact: Duggin,Sue Address: [address]          [place], [place] [address] [name] Phone: [phone] Work Phone: [phone] Mobile Phone: [phone] Relation: Daughter   Allergies  Allergen Reactions  . Codeine     Chief Complaint  Patient presents with  . Medical Management of Chronic Issues    Routine Visit     HPI:  Patient is a 82 y.o. female seen today for routine visit. She has been at her baseline with no new concerns. No new concern from nursing staff  Review of Systems:  Constitutional: Negative for fever   Respiratory: Negative for shortness of breath and cough Cardiovascular: Negative for chest pain, palpitation.  Gastrointestinal: Negative for heartburn, nausea, vomiting, abdominal pain.  Genitourinary: Negative for dysuria Musculoskeletal: Negative for fall in this facility Skin: Negative for itching, rash.  Neurological: Negative for dizziness Psychiatric/Behavioral: Negative for depression  PMH- CKD 3, CVA, DM type 2, HLD, alzhemier's disease     Medication List       Accurate as of 08/20/16  2:51 PM. Always use your most recent med list.          acetaminophen 325 MG tablet Commonly known as:  TYLENOL Take 650 mg by mouth every 6 (six) hours as needed (pain).   aspirin EC 81 MG tablet Take 81 mg by mouth daily.   atorvastatin 10 MG tablet Commonly known as:  LIPITOR Take 10 mg by mouth at bedtime.   conjugated estrogens vaginal cream Commonly known as:  PREMARIN Place 1 Applicatorful vaginally at bedtime. Insert  0.5 grams vaginally at bedtime   docusate sodium 100 MG capsule Commonly known as:  COLACE Take 100 mg by mouth 2 (two) times daily.   donepezil 10 MG tablet Commonly known as:  ARICEPT Take 10 mg by mouth at bedtime.   insulin aspart 100 UNIT/ML injection Commonly known as:  novoLOG Inject 5 Units into the skin 3 (three) times daily before meals.   insulin glargine 100 UNIT/ML injection Commonly known as:  LANTUS Inject 18 Units into the skin at bedtime.   lamoTRIgine 25 MG tablet Commonly known as:  LAMICTAL Take 25 mg by mouth 2 (two) times daily. Take 2 tablets= 50 mg by mouth twice daily   lisinopril 2.5 MG tablet Commonly known as:  PRINIVIL,ZESTRIL Take 2.5 mg by mouth daily.   Melatonin 3 MG Tabs Take 3 mg by mouth at bedtime.   nystatin cream Commonly known as:  MYCOSTATIN Apply 1 application topically daily as needed for dry skin. Nystatin 100,000 units/GM Cream. Apply topically to groin as needed for yeast   oxybutynin 5 MG tablet Commonly known as:  DITROPAN Take 1/2 tablet= 2.5 mg by mouth twice a day   polyethylene glycol packet Commonly known as:  MIRALAX / GLYCOLAX Take 17 g by mouth daily.   senna 8.6 MG tablet Commonly known as:  SENOKOT Take 1 tablet by mouth at bedtime.   Vitamin D3 2000 units Tabs Take 2,000 Units by mouth daily.  Immunizations: Immunization History  Administered Date(s) Administered  . PPD Test 01/06/2016, 01/20/2016     Physical Exam:  Vitals:   08/20/16 1443  BP: 114/60  Resp: 19  Temp: 97.1 F (36.2 C)  TempSrc: Oral  SpO2: 98%  Weight: 168 lb 6.4 oz (76.4 kg)  Height: 5' 2.5" (1.588 m)  Body mass index is 30.31 kg/m.   Wt Readings from Last 3 Encounters:  08/20/16 168 lb 6.4 oz (76.4 kg)  07/29/16 168 lb 6.4 oz (76.4 kg)  06/24/16 168 lb 6.4 oz (76.4 kg)    General- elderly female, obese, in no acute distress Head- normocephalic, atraumatic Throat- moist mucus membrane, edentulous Eyes-  PERRLA, EOMI, no pallor, no icterus, no discharge, normal conjunctiva, normal sclera Neck- no cervical lymphadenopathy Cardiovascular- normal s1,s2, no murmur Respiratory- CTAB Abdomen- bowel sounds present, soft, non tender Musculoskeletal- able to move all 4 extremities, generalized weakness, on wheelchair, needs 1 person assist with transfers, lower extremity weakness present Neurological- alert and oriented to person and place Skin- warm and dry, senile purpura + Psychiatry- normal mood, poor insight    Labs reviewed: Basic Metabolic Panel:  Recent Labs  12/31/15 01/06/16 03/19/16  NA 134* 135* 139  K 4.2 4.5 4.5  BUN 13 10 22*  CREATININE 1.0 0.9 1.0   Liver Function Tests:  Recent Labs  12/31/15 01/06/16 03/19/16  AST 16 23 14   ALT 11 14 9   ALKPHOS 115 97 85   No results for input(s): LIPASE, AMYLASE in the last 8760 hours. No results for input(s): AMMONIA in the last 8760 hours. CBC:  Recent Labs  12/31/15 01/06/16 03/19/16  WBC 8.8 6.0 6.4  HGB 14.6 15.3 14.8  HCT 45 48* 46  PLT 222 222 198   Lab Results  Component Value Date   HGBA1C 7.1 06/25/2016   Lipid Panel     Component Value Date/Time   CHOL 140 06/25/2016   TRIG 96 06/25/2016   HDL 36 06/25/2016   LDLCALC 85 06/25/2016   Microalbuminuria + on 3/17   Assessment/Plan  Hyperlipidemia Started on Atorvastatin 10 mg daily for this, tolerating well, monitor clinically. Monitor lft and lipid panel  Insomnia Continue melatonin 3 mg qhs and monitor  History of cva Continue aspirin 81 mg daily with statin.   OAB Stable, continue oxybutynin  Mixed alzheimer's and Vascular dementia Continue aricept with asa and statin. Continue supportive care.    Blanchie Serve, MD Internal Medicine [facility] [address] [place], [place] [address] Cell Phone (Monday-Friday 8 am - 5 pm): [phone] On Call: [phone] and follow prompts after 5 pm and on  weekends Office Phone: [phone] Office Fax: [phone]

## 2016-09-24 ENCOUNTER — Non-Acute Institutional Stay (SKILLED_NURSING_FACILITY): Payer: Medicare Other | Admitting: Internal Medicine

## 2016-09-24 ENCOUNTER — Encounter: Payer: Self-pay | Admitting: Internal Medicine

## 2016-09-24 DIAGNOSIS — K5909 Other constipation: Secondary | ICD-10-CM | POA: Diagnosis not present

## 2016-09-24 DIAGNOSIS — I739 Peripheral vascular disease, unspecified: Secondary | ICD-10-CM

## 2016-09-24 DIAGNOSIS — E1129 Type 2 diabetes mellitus with other diabetic kidney complication: Secondary | ICD-10-CM | POA: Diagnosis not present

## 2016-09-24 DIAGNOSIS — R809 Proteinuria, unspecified: Secondary | ICD-10-CM | POA: Diagnosis not present

## 2016-09-24 NOTE — Progress Notes (Signed)
Patient ID: Christine Summers, female   DOB: 1925-02-12, 82 y.o.   MRN: [id number]    LOCATION: [facility], [place]   Code Status: DNR   Extended Emergency Contact Information Primary Emergency Contact: Duggins,Rebecca S Address: [address]          [place], [place] [address] Johnnette Litter of [place] Phone: [phone] Work Phone: [phone] Relation: None Secondary Emergency Contact: Duggin,Sue Address: [address]          [place], [place] [address] Johnnette Litter of [place] Phone: [phone] Work Phone: [phone] Mobile Phone: [phone] Relation: Daughter   Allergies  Allergen Reactions  . Codeine     Chief Complaint  Patient presents with  . Medical Management of Chronic Issues    Routine Visit     HPI:  Patient is a 82 y.o. female seen today for routine visit. No new concern from patient ad nursing this visit.   Review of Systems:  Constitutional: Negative for fever   Respiratory: Negative for shortness of breath Cardiovascular: Negative for chest pain, palpitation.  Gastrointestinal: Negative for heartburn, nausea, vomiting Genitourinary: Negative for dysuria Musculoskeletal: Negative for fall in this facility Skin: Negative for itching, rash.  Neurological: Negative for dizziness Psychiatric/Behavioral: Negative for depression  PMH- CKD 3, CVA, DM type 2, HLD, alzhemier's disease     Medication List       Accurate as of 09/24/16  3:44 PM. Always use your most recent med list.          acetaminophen 325 MG tablet Commonly known as:  TYLENOL Take 650 mg by mouth every 6 (six) hours as needed (pain).   aspirin EC 81 MG tablet Take 81 mg by mouth daily.   atorvastatin 10 MG tablet Commonly known as:  LIPITOR Take 10 mg by mouth at bedtime.   conjugated estrogens vaginal cream Commonly known as:  PREMARIN Place 1 Applicatorful vaginally at bedtime. Insert 0.5 grams vaginally at bedtime   docusate sodium 100 MG  capsule Commonly known as:  COLACE Take 100 mg by mouth 2 (two) times daily.   donepezil 10 MG tablet Commonly known as:  ARICEPT Take 10 mg by mouth at bedtime.   insulin aspart 100 UNIT/ML injection Commonly known as:  novoLOG Inject 5 Units into the skin 3 (three) times daily before meals.   insulin glargine 100 UNIT/ML injection Commonly known as:  LANTUS Inject 18 Units into the skin at bedtime.   lamoTRIgine 25 MG tablet Commonly known as:  LAMICTAL Take 25 mg by mouth 2 (two) times daily. Take 2 tablets= 50 mg by mouth twice daily   lisinopril 2.5 MG tablet Commonly known as:  PRINIVIL,ZESTRIL Take 2.5 mg by mouth daily.   Melatonin 3 MG Tabs Take 3 mg by mouth at bedtime.   nystatin cream Commonly known as:  MYCOSTATIN Apply 1 application topically daily as needed for dry skin. Nystatin 100,000 units/GM Cream. Apply topically to groin as needed for yeast   oxybutynin 5 MG tablet Commonly known as:  DITROPAN Take 1/2 tablet= 2.5 mg by mouth twice a day   polyethylene glycol packet Commonly known as:  MIRALAX / GLYCOLAX Take 17 g by mouth daily.   senna 8.6 MG tablet Commonly known as:  SENOKOT Take 1 tablet by mouth at bedtime.   Vitamin D3 2000 units Tabs Take 2,000 Units by mouth daily.       Immunizations: Immunization History  Administered Date(s) Administered  . PPD Test  01/06/2016, 01/20/2016     Physical Exam:  Vitals:   09/24/16 1541  BP: 136/84  Pulse: 78  Resp: 20  Temp: 97.9 F (36.6 C)  TempSrc: Oral  SpO2: 97%  Weight: 177 lb 3.2 oz (80.4 kg)  Height: 5' 2.5" (1.588 m)  Body mass index is 31.89 kg/m.   Wt Readings from Last 3 Encounters:  09/24/16 177 lb 3.2 oz (80.4 kg)  08/20/16 168 lb 6.4 oz (76.4 kg)  07/29/16 168 lb 6.4 oz (76.4 kg)    General- elderly female, obese, in no acute distress Head- normocephalic, atraumatic Throat- moist mucus membrane, edentulous Neck- no cervical lymphadenopathy Cardiovascular-  normal s1,s2, no murmur Respiratory- CTAB Abdomen- bowel sounds present, soft, non tender Musculoskeletal- able to move all 4 extremities, on wheelchair, needs 1 person assist with transfers, lower extremity weakness present Neurological- alert and oriented to person and place Skin- warm and dry, senile purpura + Psychiatry- normal mood, poor insight    Labs reviewed: Basic Metabolic Panel:  Recent Labs  12/31/15 01/06/16 03/19/16  NA 134* 135* 139  K 4.2 4.5 4.5  BUN 13 10 22*  CREATININE 1.0 0.9 1.0   Liver Function Tests:  Recent Labs  12/31/15 01/06/16 03/19/16  AST 16 23 14   ALT 11 14 9   ALKPHOS 115 97 85   No results for input(s): LIPASE, AMYLASE in the last 8760 hours. No results for input(s): AMMONIA in the last 8760 hours. CBC:  Recent Labs  12/31/15 01/06/16 03/19/16  WBC 8.8 6.0 6.4  HGB 14.6 15.3 14.8  HCT 45 48* 46  PLT 222 222 198   Lab Results  Component Value Date   HGBA1C 7.1 06/25/2016   Lipid Panel     Component Value Date/Time   CHOL 140 06/25/2016   TRIG 96 06/25/2016   HDL 36 06/25/2016   LDLCALC 85 06/25/2016   Microalbuminuria + on 3/17   Assessment/Plan  PVD Stable, no wound/ sore reported. Continue baby aspirin  Chronic constipation Continue colace 100 mg bid and miralax daily  Diabetic nephropathy Continue lisinopril for renal protection. Monitor cbg, continue insulin   Blanchie Serve, MD Internal Medicine [facility] [address] [place], [place] [address] Cell Phone (Monday-Friday 8 am - 5 pm): [phone] On Call: [phone] and follow prompts after 5 pm and on weekends Office Phone: [phone] Office Fax: [phone]

## 2016-10-21 LAB — BASIC METABOLIC PANEL
BUN: 34 mg/dL — AB (ref 4–21)
CREATININE: 1.3 mg/dL — AB (ref 0.5–1.1)
Glucose: 165 mg/dL
POTASSIUM: 5.2 mmol/L (ref 3.4–5.3)
SODIUM: 138 mmol/L (ref 137–147)

## 2016-10-21 LAB — HEPATIC FUNCTION PANEL
ALK PHOS: 91 U/L (ref 25–125)
ALT: 11 U/L (ref 7–35)
AST: 19 U/L (ref 13–35)
Bilirubin, Total: 0.6 mg/dL

## 2016-10-21 LAB — CBC AND DIFFERENTIAL
HCT: 44 % (ref 36–46)
Hemoglobin: 14.3 g/dL (ref 12.0–16.0)
PLATELETS: 243 10*3/uL (ref 150–399)
WBC: 7.9 10^3/mL

## 2016-10-21 LAB — HEMOGLOBIN A1C: Hemoglobin A1C: 6.9

## 2016-11-23 ENCOUNTER — Non-Acute Institutional Stay (SKILLED_NURSING_FACILITY): Payer: Medicare Other | Admitting: Internal Medicine

## 2016-11-23 ENCOUNTER — Encounter: Payer: Self-pay | Admitting: Internal Medicine

## 2016-11-23 DIAGNOSIS — N3281 Overactive bladder: Secondary | ICD-10-CM | POA: Diagnosis not present

## 2016-11-23 DIAGNOSIS — E785 Hyperlipidemia, unspecified: Secondary | ICD-10-CM

## 2016-11-23 DIAGNOSIS — N952 Postmenopausal atrophic vaginitis: Secondary | ICD-10-CM

## 2016-11-23 NOTE — Progress Notes (Signed)
Patient ID: MYSTICA CEDERQUIST, female   DOB: December 26, 1924, 83 y.o.   MRN: [id number]    LOCATION: [facility], [place]   Code Status: DNR   Extended Emergency Contact Information Primary Emergency Contact: Duggins,Rebecca S Address: [address]          [place], [place] [address] Johnnette Litter of [place] Phone: [phone] Work Phone: [phone] Relation: None Secondary Emergency Contact: Duggin,Sue Address: [address]          [place], [place] [address] Johnnette Litter of [place] Phone: [phone] Work Phone: [phone] Mobile Phone: [phone] Relation: Daughter   Allergies  Allergen Reactions  . Codeine     Chief Complaint  Patient presents with  . Medical Management of Chronic Issues    Routine Visit     HPI:  Patient is a 83 y.o. female seen today for routine visit. No new concern from patient this visit. No concerns from nursing.   Review of Systems:  Constitutional: Negative for fever   Respiratory: Negative for shortness of breath Cardiovascular: Negative for chest pain, palpitation.  Gastrointestinal: Negative for heartburn, nausea, vomiting Genitourinary: Negative for dysuria Musculoskeletal: Negative for fall in this facility Skin: Negative for itching, rash.  Neurological: Negative for dizziness Psychiatric/Behavioral: Negative for depression  PMH- CKD 3, CVA, DM type 2, HLD, alzhemier's disease   Allergies as of 11/23/2016      Reactions   Codeine       Medication List       Accurate as of 11/23/16  3:11 PM. Always use your most recent med list.          acetaminophen 325 MG tablet Commonly known as:  TYLENOL Take 650 mg by mouth every 6 (six) hours as needed (pain).   aspirin EC 81 MG tablet Take 81 mg by mouth daily.   atorvastatin 10 MG tablet Commonly known as:  LIPITOR Take 10 mg by mouth at bedtime.   conjugated estrogens vaginal cream Commonly known as:  PREMARIN Place 1 Applicatorful vaginally at  bedtime. Insert 0.5 grams vaginally at bedtime. Give on Monday, Wednesday and Friday   docusate sodium 100 MG capsule Commonly known as:  COLACE Take 100 mg by mouth 2 (two) times daily.   donepezil 10 MG tablet Commonly known as:  ARICEPT Take 10 mg by mouth at bedtime.   eucerin cream Apply 1 application topically at bedtime.   insulin aspart 100 UNIT/ML injection Commonly known as:  novoLOG Inject 5 Units into the skin 3 (three) times daily before meals.   insulin glargine 100 UNIT/ML injection Commonly known as:  LANTUS Inject 18 Units into the skin at bedtime.   lamoTRIgine 25 MG tablet Commonly known as:  LAMICTAL Take 25 mg by mouth 2 (two) times daily. Take 2 tablets= 50 mg by mouth twice daily   lisinopril 2.5 MG tablet Commonly known as:  PRINIVIL,ZESTRIL Take 2.5 mg by mouth daily.   Melatonin 3 MG Tabs Take 3 mg by mouth at bedtime.   nystatin cream Commonly known as:  MYCOSTATIN Apply 1 application topically daily as needed for dry skin. Nystatin 100,000 units/GM Cream. Apply topically to groin as needed for yeast   oxybutynin 5 MG tablet Commonly known as:  DITROPAN Take 1/2 tablet= 2.5 mg by mouth twice a day   polyethylene glycol packet Commonly known as:  MIRALAX / GLYCOLAX Take 17 g by mouth daily.   senna 8.6 MG tablet Commonly known as:  SENOKOT Take 1  tablet by mouth at bedtime.   Vitamin D3 2000 units Tabs Take 2,000 Units by mouth daily.       Immunizations: Immunization History  Administered Date(s) Administered  . PPD Test 01/06/2016, 01/20/2016     Physical Exam:  Vitals:   11/23/16 1459  BP: 136/66  Pulse: 72  Resp: 18  Temp: 97.9 F (36.6 C)  TempSrc: Oral  SpO2: 94%  Weight: 176 lb 12.8 oz (80.2 kg)  Height: 5' 2.5" (1.588 m)  Body mass index is 31.82 kg/m.   Wt Readings from Last 3 Encounters:  11/23/16 176 lb 12.8 oz (80.2 kg)  09/24/16 177 lb 3.2 oz (80.4 kg)  08/20/16 168 lb 6.4 oz (76.4 kg)     General- elderly female, obese, in no acute distress Head- normocephalic, atraumatic Throat- moist mucus membrane, edentulous Neck- no cervical lymphadenopathy Cardiovascular- normal s1,s2, no murmur Respiratory- CTAB Abdomen- bowel sounds present, soft, non tender Musculoskeletal- able to move all 4 extremities, on wheelchair, needs 1 person assist with transfers, lower extremity weakness present Neurological- alert and oriented to person and place Skin- warm and dry, senile purpura + Psychiatry- normal mood, poor insight    Labs reviewed: Basic Metabolic Panel:  Recent Labs  01/06/16 03/19/16 10/21/16  NA 135* 139 138  K 4.5 4.5 5.2  BUN 10 22* 34*  CREATININE 0.9 1.0 1.3*   Liver Function Tests:  Recent Labs  01/06/16 03/19/16 10/21/16  AST 23 14 19   ALT 14 9 11   ALKPHOS 97 85 91   No results for input(s): LIPASE, AMYLASE in the last 8760 hours. No results for input(s): AMMONIA in the last 8760 hours. CBC:  Recent Labs  01/06/16 03/19/16 10/21/16  WBC 6.0 6.4 7.9  HGB 15.3 14.8 14.3  HCT 48* 46 44  PLT 222 198 243   Lab Results  Component Value Date   HGBA1C 6.9 10/21/2016   Lipid Panel     Component Value Date/Time   CHOL 140 06/25/2016   TRIG 96 06/25/2016   HDL 36 06/25/2016   LDLCALC 85 06/25/2016   Microalbuminuria + on 3/17   Assessment/Plan  Hyperlipidemia Lipid Panel     Component Value Date/Time   CHOL 140 06/25/2016   TRIG 96 06/25/2016   HDL 36 06/25/2016   LDLCALC 85 06/25/2016   LDL at goal. Continue atorvastatin 10 mg daily  Atrophic vaginitis Continue premarin vaginal cream three times a week and monitor  OAB Continue oxybutynin    Blanchie Serve, MD Internal Medicine [facility] [place], [place] [address] Cell Phone (Monday-Friday 8 am - 5 pm): [phone] On Call: [phone] and follow prompts after 5 pm and on weekends Office Phone: [phone] Office  Fax: [phone]

## 2017-01-22 LAB — BASIC METABOLIC PANEL
BUN: 40 mg/dL — AB (ref 4–21)
CREATININE: 1.5 mg/dL — AB (ref 0.5–1.1)
Glucose: 182 mg/dL
POTASSIUM: 5.7 mmol/L — AB (ref 3.4–5.3)
SODIUM: 133 mmol/L — AB (ref 137–147)

## 2017-01-22 LAB — CBC AND DIFFERENTIAL
HCT: 43 % (ref 36–46)
Hemoglobin: 14.3 g/dL (ref 12.0–16.0)
Platelets: 271 10*3/uL (ref 150–399)
WBC: 7.7 10^3/mL

## 2017-02-01 ENCOUNTER — Non-Acute Institutional Stay (SKILLED_NURSING_FACILITY): Payer: Medicare Other | Admitting: Internal Medicine

## 2017-02-01 ENCOUNTER — Encounter: Payer: Self-pay | Admitting: Internal Medicine

## 2017-02-01 DIAGNOSIS — E1022 Type 1 diabetes mellitus with diabetic chronic kidney disease: Secondary | ICD-10-CM | POA: Diagnosis not present

## 2017-02-01 DIAGNOSIS — E1169 Type 2 diabetes mellitus with other specified complication: Secondary | ICD-10-CM

## 2017-02-01 DIAGNOSIS — J441 Chronic obstructive pulmonary disease with (acute) exacerbation: Secondary | ICD-10-CM

## 2017-02-01 DIAGNOSIS — E785 Hyperlipidemia, unspecified: Secondary | ICD-10-CM | POA: Diagnosis not present

## 2017-02-01 DIAGNOSIS — J189 Pneumonia, unspecified organism: Secondary | ICD-10-CM

## 2017-02-01 DIAGNOSIS — N183 Chronic kidney disease, stage 3 (moderate): Secondary | ICD-10-CM

## 2017-02-01 DIAGNOSIS — J181 Lobar pneumonia, unspecified organism: Principal | ICD-10-CM

## 2017-02-01 NOTE — Progress Notes (Signed)
Patient ID: JANESHIA CILIBERTO, female   DOB: 06/29/1925, 83 y.o.   MRN: [id number]    LOCATION: [facility], [place]   Code Status: DNR   Extended Emergency Contact Information Primary Emergency Contact: Duggins,Rebecca S Address: [address]          [place], [place] [address] Johnnette Litter of [place] Phone: [phone] Work Phone: [phone] Relation: None Secondary Emergency Contact: Duggin,Sue Address: [address]          [place], [place] [address] Johnnette Litter of [place] Phone: [phone] Work Phone: [phone] Mobile Phone: [phone] Relation: Daughter   Allergies  Allergen Reactions  . Codeine     Chief Complaint  Patient presents with  . Medical Management of Chronic Issues    Routine Visit      HPI:  Patient is a 83 y.o. female seen today for routine visit. She recently completed antibiotic for bibasilar pneumonia. She continues to cough with expectorant. She appears weak and tired. She does not participate in HPI and ROS this visit.   Review of Systems: per nursing Constitutional: Negative for fever   Respiratory: Negative for shortness of breath Cardiovascular: Negative for chest pain, palpitation.  Gastrointestinal: Negative for vomiting, diarrhea Musculoskeletal: Negative for fall in this facility    Allergies as of 02/01/2017      Reactions   Codeine       Medication List       Accurate as of 02/01/17  3:31 PM. Always use your most recent med list.          acetaminophen 325 MG tablet Commonly known as:  TYLENOL Take 650 mg by mouth every 6 (six) hours as needed (pain).   aspirin EC 81 MG tablet Take 81 mg by mouth daily.   atorvastatin 10 MG tablet Commonly known as:  LIPITOR Take 10 mg by mouth at bedtime.   conjugated estrogens vaginal cream Commonly known as:  PREMARIN Place 1 Applicatorful vaginally at bedtime. Insert 0.5 grams vaginally at bedtime. Give on Monday, Wednesday and Friday     dextromethorphan 15 MG/5ML syrup Take 15 mLs by mouth every 6 (six) hours as needed for cough.   docusate sodium 100 MG capsule Commonly known as:  COLACE Take 100 mg by mouth 2 (two) times daily.   donepezil 10 MG tablet Commonly known as:  ARICEPT Take 10 mg by mouth at bedtime.   eucerin cream Apply 1 application topically at bedtime.   insulin aspart 100 UNIT/ML injection Commonly known as:  novoLOG Inject 5 Units into the skin 3 (three) times daily before meals.   insulin glargine 100 UNIT/ML injection Commonly known as:  LANTUS Inject 18 Units into the skin at bedtime.   lamoTRIgine 25 MG tablet Commonly known as:  LAMICTAL Take 25 mg by mouth 2 (two) times daily. Take 2 tablets= 50 mg by mouth twice daily   lisinopril 2.5 MG tablet Commonly known as:  PRINIVIL,ZESTRIL Take 2.5 mg by mouth daily.   Melatonin 3 MG Tabs Take 3 mg by mouth at bedtime.   nystatin cream Commonly known as:  MYCOSTATIN Apply 1 application topically daily as needed for dry skin. Nystatin 100,000 units/GM Cream. Apply topically to groin as needed for yeast   oxybutynin 5 MG tablet Commonly known as:  DITROPAN Take 1/2 tablet= 2.5 mg by mouth twice a day   polyethylene glycol packet Commonly known as:  MIRALAX / GLYCOLAX Take 17 g by mouth daily.   saccharomyces  boulardii 250 MG capsule Commonly known as:  FLORASTOR Take 250 mg by mouth 2 (two) times daily. Stop date 02/11/17   senna 8.6 MG tablet Commonly known as:  SENOKOT Take 1 tablet by mouth at bedtime.   Vitamin D3 2000 units Tabs Take 2,000 Units by mouth daily.       Immunizations: Immunization History  Administered Date(s) Administered  . Influenza-Unspecified 12/10/2016  . PPD Test 01/06/2016, 01/20/2016     Physical Exam:  Vitals:   02/01/17 1523  BP: 122/84  Pulse: 70  Resp: 19  Temp: 97.2 F (36.2 C)  TempSrc: Oral  SpO2: 96%  Weight: 173 lb 3.2 oz (78.6 kg)  Height: 5' 2.5" (1.588 m)  Body  mass index is 31.17 kg/m.   Wt Readings from Last 3 Encounters:  02/01/17 173 lb 3.2 oz (78.6 kg)  11/23/16 176 lb 12.8 oz (80.2 kg)  09/24/16 177 lb 3.2 oz (80.4 kg)    General- elderly female, obese, in no acute distress Head- normocephalic, atraumatic Throat- white coating to her tongue, moist mucus membrane, edentulous Neck- no cervical lymphadenopathy Cardiovascular- normal s1,s2, no murmur Respiratory- poor air entry to both lung bases, rhonchi +, no wheeze or crackles Abdomen- bowel sounds present, soft, non tender Musculoskeletal- able to move all 4 extremities, on wheelchair Neurological- alert and oriented to self only Skin- warm and dry, senile purpura + Psychiatry- poor insight    Labs reviewed: Basic Metabolic Panel:  Recent Labs  03/19/16 10/21/16 01/22/17  NA 139 138 133*  K 4.5 5.2 5.7*  BUN 22* 34* 40*  CREATININE 1.0 1.3* 1.5*   Liver Function Tests:  Recent Labs  03/19/16 10/21/16  AST 14 19  ALT 9 11  ALKPHOS 85 91   No results for input(s): LIPASE, AMYLASE in the last 8760 hours. No results for input(s): AMMONIA in the last 8760 hours. CBC:  Recent Labs  03/19/16 10/21/16 01/22/17  WBC 6.4 7.9 7.7  HGB 14.8 14.3 14.3  HCT 46 44 43  PLT 198 243 271   Lab Results  Component Value Date   HGBA1C 6.9 10/21/2016   Lipid Panel     Component Value Date/Time   CHOL 140 06/25/2016   TRIG 96 06/25/2016   HDL 36 06/25/2016   LDLCALC 85 06/25/2016   Microalbuminuria + on 3/17   Assessment/Plan  Bibasilar pneumonia Completed 1 week course of levofloxacin. Continues to have cough. She is afebrile but appears more confused. Start robitussin 30 cc bid x 5 days to help with cough. start duoneb qid  COPD exacerbation Increased cough with increased expectorant and has poor air movement on lung exam. Start prednisone 50 mg daily x 5 days, duoneb qid. She will need strict aspiration precautions. SLP consult.   Hyperlipidemia Lipid Panel       Component Value Date/Time   CHOL 140 06/25/2016   TRIG 96 06/25/2016   HDL 36 06/25/2016   LDLCALC 85 06/25/2016   Check lipid panel. Continue atorvastatin 10 mg daily  Dm type 2 with ckd stage 3 Lab Results  Component Value Date   HGBA1C 6.9 10/21/2016   Check a1c. Continue current regimen lantus 18 u and SSI humalog 5 u tid with meals for cbg >150. Decrease dose of lantus if a1c remains <7. Continue lisinopril  Labs- cbc, bmp, a1c   Blanchie Serve, MD Internal Medicine [facility] [address] [place], [place] [address] Cell Phone (Monday-Friday 8 am - 5  pm): [phone] On Call: [phone] and follow prompts after 5 pm and on weekends Office Phone: [phone] Office Fax: [phone]

## 2017-02-08 LAB — LIPID PANEL
CHOLESTEROL: 110 mg/dL (ref 0–200)
HDL: 35 mg/dL (ref 35–70)
LDL CALC: 22 mg/dL
TRIGLYCERIDES: 109 mg/dL (ref 40–160)

## 2017-02-08 LAB — HEMOGLOBIN A1C: Hemoglobin A1C: 7.3

## 2017-02-08 LAB — CBC AND DIFFERENTIAL
HCT: 49 % — AB (ref 36–46)
Hemoglobin: 15.5 g/dL (ref 12.0–16.0)
Platelets: 187 10*3/uL (ref 150–399)
WBC: 11.8 10^3/mL

## 2017-02-08 LAB — BASIC METABOLIC PANEL
BUN: 23 mg/dL — AB (ref 4–21)
Creatinine: 0.9 mg/dL (ref 0.5–1.1)
Glucose: 111 mg/dL
POTASSIUM: 5.2 mmol/L (ref 3.4–5.3)
Sodium: 139 mmol/L (ref 137–147)

## 2017-02-09 LAB — BASIC METABOLIC PANEL
BUN: 27 mg/dL — AB (ref 4–21)
Creatinine: 1.1 mg/dL (ref 0.5–1.1)
GLUCOSE: 119 mg/dL
Potassium: 4.6 mmol/L (ref 3.4–5.3)
SODIUM: 142 mmol/L (ref 137–147)

## 2017-02-09 LAB — CBC AND DIFFERENTIAL
HCT: 41 % (ref 36–46)
HEMOGLOBIN: 13.4 g/dL (ref 12.0–16.0)
PLATELETS: 146 10*3/uL — AB (ref 150–399)
WBC: 10.1 10*3/mL

## 2017-02-10 LAB — CBC AND DIFFERENTIAL
HCT: 40 % (ref 36–46)
Hemoglobin: 13 g/dL (ref 12.0–16.0)
Platelets: 134 10*3/uL — AB (ref 150–399)
WBC: 8.2 10^3/mL

## 2017-02-11 LAB — BASIC METABOLIC PANEL WITH GFR
BUN: 11 mg/dL (ref 4–21)
Creatinine: 0.8 mg/dL (ref 0.5–1.1)
Glucose: 101 mg/dL
Potassium: 4.2 mmol/L (ref 3.4–5.3)
Sodium: 139 mmol/L (ref 137–147)

## 2017-02-11 LAB — CBC AND DIFFERENTIAL
HCT: 40 % (ref 36–46)
Hemoglobin: 13.2 g/dL (ref 12.0–16.0)
Platelets: 160 K/µL (ref 150–399)
WBC: 6.7 10*3/mL

## 2017-03-02 ENCOUNTER — Non-Acute Institutional Stay (SKILLED_NURSING_FACILITY): Payer: Medicare Other | Admitting: Internal Medicine

## 2017-03-02 DIAGNOSIS — F329 Major depressive disorder, single episode, unspecified: Secondary | ICD-10-CM

## 2017-03-02 DIAGNOSIS — L89153 Pressure ulcer of sacral region, stage 3: Secondary | ICD-10-CM | POA: Diagnosis not present

## 2017-03-02 DIAGNOSIS — F341 Dysthymic disorder: Secondary | ICD-10-CM

## 2017-03-02 DIAGNOSIS — E440 Moderate protein-calorie malnutrition: Secondary | ICD-10-CM | POA: Diagnosis not present

## 2017-03-02 DIAGNOSIS — E559 Vitamin D deficiency, unspecified: Secondary | ICD-10-CM

## 2017-03-02 NOTE — Progress Notes (Signed)
Patient ID: Christine Summers, female   DOB: Sep 27, 1925, 83 y.o.   MRN: [id number]    LOCATION: [facility], [place]   Code Status: DNR   Extended Emergency Contact Information Primary Emergency Contact: Duggins,Rebecca S Address: [address]          [place], [place] [address] Johnnette Litter of [place] Phone: [phone] Work Phone: [phone] Relation: None Secondary Emergency Contact: Duggin,Sue Address: [address]          [place], [place] [address] Johnnette Litter of [employer] Phone: [phone] Work Phone: [phone] Mobile Phone: [phone] Relation: Daughter   Allergies  Allergen Reactions  . Codeine     Chief Complaint  Patient presents with  . Medical Management of Chronic Issues    Routine Visit      HPI:  Patient is a 83 y.o. female seen today for routine visit. She continues to receive wound care. She has been participating in group activities.   Review of Systems: per nursing Constitutional: Negative for fever   Respiratory: Negative for cough, shortness of breath and wheezing Cardiovascular: Negative for chest pain, palpitation.  Gastrointestinal: Negative for vomiting, diarrhea, abdominal pain, nausea.  Musculoskeletal: Negative for fall in this facility    Allergies as of 03/02/2017      Reactions   Codeine       Medication List       Accurate as of 03/02/17  2:54 PM. Always use your most recent med list.          acetaminophen 325 MG tablet Commonly known as:  TYLENOL Take 650 mg by mouth every 6 (six) hours as needed (pain).   aspirin EC 81 MG tablet Take 81 mg by mouth daily.   atorvastatin 10 MG tablet Commonly known as:  LIPITOR Take 10 mg by mouth at bedtime.   DECUBI-VITE PO Take 1 tablet by mouth daily.   dextromethorphan 15 MG/5ML syrup Take 15 mLs by mouth every 6 (six) hours as needed for cough.   docusate sodium 100 MG capsule Commonly known as:  COLACE Take 100 mg by mouth 2 (two) times  daily.   donepezil 10 MG tablet Commonly known as:  ARICEPT Take 10 mg by mouth at bedtime.   eucerin cream Apply 1 application topically at bedtime.   insulin aspart 100 UNIT/ML injection Commonly known as:  novoLOG Inject 5 Units into the skin 3 (three) times daily before meals.   insulin glargine 100 UNIT/ML injection Commonly known as:  LANTUS Inject 18 Units into the skin at bedtime.   ipratropium-albuterol 0.5-2.5 (3) MG/3ML Soln Commonly known as:  DUONEB Take 3 mLs by nebulization every 6 (six) hours as needed.   lamoTRIgine 25 MG tablet Commonly known as:  LAMICTAL Take 50 mg by mouth 2 (two) times daily.   Melatonin 3 MG Tabs Take 3 mg by mouth at bedtime.   nystatin cream Commonly known as:  MYCOSTATIN Apply 1 application topically as needed for dry skin. Nystatin 100,000 units/GM Cream. Apply topically to groin as needed for yeast   oxybutynin 5 MG tablet Commonly known as:  DITROPAN Take 1/2 tablet= 2.5 mg by mouth twice a day   polyethylene glycol packet Commonly known as:  MIRALAX / GLYCOLAX Take 17 g by mouth daily.   senna 8.6 MG tablet Commonly known as:  SENOKOT Take 1 tablet by mouth at bedtime.   UNABLE TO FIND Med Name: Med pass 90 mL by mouth 2 times daily  Vitamin D3 2000 units Tabs Take 2,000 Units by mouth daily.       Immunizations: Immunization History  Administered Date(s) Administered  . Influenza-Unspecified 12/10/2016  . PPD Test 01/06/2016, 01/20/2016     Physical Exam:  Vitals:   03/02/17 1435  BP: 104/64  Pulse: 82  Resp: 18  Temp: 97.5 F (36.4 C)  TempSrc: Oral  SpO2: 95%  Weight: 161 lb 6.4 oz (73.2 kg)  Height: 5' 2.5" (1.588 m)  Body mass index is 29.05 kg/m.   Wt Readings from Last 3 Encounters:  03/02/17 161 lb 6.4 oz (73.2 kg)  02/01/17 173 lb 3.2 oz (78.6 kg)  11/23/16 176 lb 12.8 oz (80.2 kg)    General- elderly female, overweight, in no acute distress Head- normocephalic,  atraumatic Throat- moist mucus membrane, edentulous Neck- no cervical lymphadenopathy Cardiovascular- normal s1,s2, no murmur Respiratory- CTAB Abdomen- bowel sounds present, soft, non tender Musculoskeletal- able to move all 4 extremities, on wheelchair Neurological- alert and oriented to self only Skin- warm and dry, senile purpura + Psychiatry- poor insight    Labs reviewed: Basic Metabolic Panel:  Recent Labs  02/08/17 02/09/17 02/11/17  NA 139 142 139  K 5.2 4.6 4.2  BUN 23* 27* 11  CREATININE 0.9 1.1 0.8   Liver Function Tests:  Recent Labs  03/19/16 10/21/16  AST 14 19  ALT 9 11  ALKPHOS 85 91   No results for input(s): LIPASE, AMYLASE in the last 8760 hours. No results for input(s): AMMONIA in the last 8760 hours. CBC:  Recent Labs  02/09/17 02/10/17 02/11/17  WBC 10.1 8.2 6.7  HGB 13.4 13.0 13.2  HCT 41 40 40  PLT 146* 134* 160   Lab Results  Component Value Date   HGBA1C 7.3 02/08/2017   Lipid Panel     Component Value Date/Time   CHOL 110 02/08/2017   TRIG 109 02/08/2017   HDL 35 02/08/2017   LDLCALC 22 02/08/2017   Microalbuminuria + on 3/17   Assessment/Plan  Stage 3 pressure ulcer to sacrum To povide wound care. Provide gel overlay mattress and gel cushion for wheelchair. Continue decubivite.  Protein calorie malnutrition Continue medpass with decubivite and monitor her weight  Vitamin d def Continue vitamin d supplement and monitor  Chronic depression Stable mood. continue lamictal 50 mg bid.     Blanchie Serve, MD Internal Medicine [facility] [address] [place], [place] [address] Cell Phone (Monday-Friday 8 am - 5 pm): [phone] On Call: [phone] and follow prompts after 5 pm and on weekends Office Phone: [phone] Office Fax: [phone]

## 2018-06-29 ENCOUNTER — Encounter: Payer: Self-pay | Admitting: Internal Medicine

## 2019-01-10 ENCOUNTER — Other Ambulatory Visit: Payer: Self-pay

## 2019-01-10 ENCOUNTER — Encounter (HOSPITAL_COMMUNITY): Payer: Self-pay

## 2019-01-10 ENCOUNTER — Inpatient Hospital Stay (HOSPITAL_COMMUNITY)
Admission: EM | Admit: 2019-01-10 | Discharge: 2019-01-16 | DRG: 871 | Disposition: A | Discharge status: Skilled Nursing Facility | Payer: Medicare Other | Attending: Internal Medicine | Admitting: Internal Medicine

## 2019-01-10 ENCOUNTER — Emergency Department (HOSPITAL_COMMUNITY): Payer: Medicare Other

## 2019-01-10 DIAGNOSIS — R579 Shock, unspecified: Secondary | ICD-10-CM | POA: Diagnosis not present

## 2019-01-10 DIAGNOSIS — R6521 Severe sepsis with septic shock: Secondary | ICD-10-CM | POA: Diagnosis not present

## 2019-01-10 DIAGNOSIS — N179 Acute kidney failure, unspecified: Secondary | ICD-10-CM | POA: Diagnosis not present

## 2019-01-10 DIAGNOSIS — E875 Hyperkalemia: Secondary | ICD-10-CM | POA: Diagnosis present

## 2019-01-10 DIAGNOSIS — E1122 Type 2 diabetes mellitus with diabetic chronic kidney disease: Secondary | ICD-10-CM | POA: Diagnosis present

## 2019-01-10 DIAGNOSIS — N183 Chronic kidney disease, stage 3 (moderate): Secondary | ICD-10-CM | POA: Diagnosis present

## 2019-01-10 DIAGNOSIS — E1140 Type 2 diabetes mellitus with diabetic neuropathy, unspecified: Secondary | ICD-10-CM

## 2019-01-10 DIAGNOSIS — E785 Hyperlipidemia, unspecified: Secondary | ICD-10-CM | POA: Diagnosis present

## 2019-01-10 DIAGNOSIS — Z885 Allergy status to narcotic agent status: Secondary | ICD-10-CM

## 2019-01-10 DIAGNOSIS — D631 Anemia in chronic kidney disease: Secondary | ICD-10-CM | POA: Diagnosis present

## 2019-01-10 DIAGNOSIS — J189 Pneumonia, unspecified organism: Secondary | ICD-10-CM

## 2019-01-10 DIAGNOSIS — Z79890 Hormone replacement therapy (postmenopausal): Secondary | ICD-10-CM

## 2019-01-10 DIAGNOSIS — A4189 Other specified sepsis: Principal | ICD-10-CM | POA: Diagnosis present

## 2019-01-10 DIAGNOSIS — N390 Urinary tract infection, site not specified: Secondary | ICD-10-CM

## 2019-01-10 DIAGNOSIS — Z794 Long term (current) use of insulin: Secondary | ICD-10-CM | POA: Diagnosis not present

## 2019-01-10 DIAGNOSIS — F0390 Unspecified dementia without behavioral disturbance: Secondary | ICD-10-CM | POA: Diagnosis present

## 2019-01-10 DIAGNOSIS — Z7982 Long term (current) use of aspirin: Secondary | ICD-10-CM | POA: Diagnosis not present

## 2019-01-10 DIAGNOSIS — D696 Thrombocytopenia, unspecified: Secondary | ICD-10-CM | POA: Diagnosis present

## 2019-01-10 DIAGNOSIS — Z66 Do not resuscitate: Secondary | ICD-10-CM | POA: Diagnosis present

## 2019-01-10 DIAGNOSIS — J9601 Acute respiratory failure with hypoxia: Secondary | ICD-10-CM

## 2019-01-10 DIAGNOSIS — J9602 Acute respiratory failure with hypercapnia: Secondary | ICD-10-CM | POA: Diagnosis not present

## 2019-01-10 DIAGNOSIS — I129 Hypertensive chronic kidney disease with stage 1 through stage 4 chronic kidney disease, or unspecified chronic kidney disease: Secondary | ICD-10-CM | POA: Diagnosis present

## 2019-01-10 DIAGNOSIS — Z8673 Personal history of transient ischemic attack (TIA), and cerebral infarction without residual deficits: Secondary | ICD-10-CM

## 2019-01-10 DIAGNOSIS — G9341 Metabolic encephalopathy: Secondary | ICD-10-CM | POA: Diagnosis present

## 2019-01-10 DIAGNOSIS — R4182 Altered mental status, unspecified: Secondary | ICD-10-CM | POA: Diagnosis not present

## 2019-01-10 DIAGNOSIS — Y95 Nosocomial condition: Secondary | ICD-10-CM | POA: Diagnosis present

## 2019-01-10 DIAGNOSIS — Z79899 Other long term (current) drug therapy: Secondary | ICD-10-CM

## 2019-01-10 DIAGNOSIS — J111 Influenza due to unidentified influenza virus with other respiratory manifestations: Secondary | ICD-10-CM | POA: Diagnosis not present

## 2019-01-10 DIAGNOSIS — J101 Influenza due to other identified influenza virus with other respiratory manifestations: Secondary | ICD-10-CM | POA: Diagnosis not present

## 2019-01-10 DIAGNOSIS — J1000 Influenza due to other identified influenza virus with unspecified type of pneumonia: Secondary | ICD-10-CM | POA: Diagnosis present

## 2019-01-10 DIAGNOSIS — A419 Sepsis, unspecified organism: Secondary | ICD-10-CM | POA: Diagnosis present

## 2019-01-10 LAB — COMPREHENSIVE METABOLIC PANEL
ALT: 13 U/L (ref 0–44)
AST: 46 U/L — ABNORMAL HIGH (ref 15–41)
Albumin: 2.6 g/dL — ABNORMAL LOW (ref 3.5–5.0)
Alkaline Phosphatase: 64 U/L (ref 38–126)
Anion gap: 7 (ref 5–15)
BILIRUBIN TOTAL: 1.3 mg/dL — AB (ref 0.3–1.2)
BUN: 26 mg/dL — ABNORMAL HIGH (ref 8–23)
CALCIUM: 8.1 mg/dL — AB (ref 8.9–10.3)
CO2: 21 mmol/L — ABNORMAL LOW (ref 22–32)
Chloride: 103 mmol/L (ref 98–111)
Creatinine, Ser: 1.37 mg/dL — ABNORMAL HIGH (ref 0.44–1.00)
GFR calc Af Amer: 39 mL/min — ABNORMAL LOW (ref >60)
GFR calc non Af Amer: 34 mL/min — ABNORMAL LOW (ref >60)
Glucose, Bld: 210 mg/dL — ABNORMAL HIGH (ref 70–99)
Potassium: 5.5 mmol/L — ABNORMAL HIGH (ref 3.5–5.1)
Sodium: 131 mmol/L — ABNORMAL LOW (ref 135–145)
TOTAL PROTEIN: 5.4 g/dL — AB (ref 6.5–8.1)

## 2019-01-10 LAB — URINALYSIS, ROUTINE W REFLEX MICROSCOPIC
Bilirubin Urine: NEGATIVE
Glucose, UA: NEGATIVE mg/dL
Hgb urine dipstick: NEGATIVE
Ketones, ur: NEGATIVE mg/dL
Nitrite: NEGATIVE
Protein, ur: NEGATIVE mg/dL
SPECIFIC GRAVITY, URINE: 1.02 (ref 1.005–1.030)
pH: 5 (ref 5.0–8.0)

## 2019-01-10 LAB — POCT I-STAT 7, (LYTES, BLD GAS, ICA,H+H)
Acid-base deficit: 2 mmol/L (ref 0.0–2.0)
Bicarbonate: 24.9 mmol/L (ref 20.0–28.0)
Calcium, Ion: 1.21 mmol/L (ref 1.15–1.40)
HCT: 35 % — ABNORMAL LOW (ref 36.0–46.0)
HEMOGLOBIN: 11.9 g/dL — AB (ref 12.0–15.0)
O2 Saturation: 100 %
Patient temperature: 98.6
Potassium: 3.9 mmol/L (ref 3.5–5.1)
Sodium: 135 mmol/L (ref 135–145)
TCO2: 26 mmol/L (ref 22–32)
pCO2 arterial: 51.9 mmHg — ABNORMAL HIGH (ref 32.0–48.0)
pH, Arterial: 7.289 — ABNORMAL LOW (ref 7.350–7.450)
pO2, Arterial: 282 mmHg — ABNORMAL HIGH (ref 83.0–108.0)

## 2019-01-10 LAB — CBC WITH DIFFERENTIAL/PLATELET
Abs Immature Granulocytes: 0.04 10*3/uL (ref 0.00–0.07)
Basophils Absolute: 0 10*3/uL (ref 0.0–0.1)
Basophils Relative: 1 %
Eosinophils Absolute: 0 10*3/uL (ref 0.0–0.5)
Eosinophils Relative: 0 %
HCT: 41.2 % (ref 36.0–46.0)
Hemoglobin: 12.7 g/dL (ref 12.0–15.0)
Immature Granulocytes: 1 %
Lymphocytes Relative: 16 %
Lymphs Abs: 0.6 10*3/uL — ABNORMAL LOW (ref 0.7–4.0)
MCH: 27.8 pg (ref 26.0–34.0)
MCHC: 30.8 g/dL (ref 30.0–36.0)
MCV: 90.2 fL (ref 80.0–100.0)
MONO ABS: 0.5 10*3/uL (ref 0.1–1.0)
Monocytes Relative: 12 %
Neutro Abs: 2.6 10*3/uL (ref 1.7–7.7)
Neutrophils Relative %: 70 %
Platelets: 133 10*3/uL — ABNORMAL LOW (ref 150–400)
RBC: 4.57 MIL/uL (ref 3.87–5.11)
RDW: 13.7 % (ref 11.5–15.5)
WBC: 3.8 10*3/uL — ABNORMAL LOW (ref 4.0–10.5)
nRBC: 0 % (ref 0.0–0.2)

## 2019-01-10 LAB — INFLUENZA PANEL BY PCR (TYPE A & B)
INFLBPCR: NEGATIVE
Influenza A By PCR: POSITIVE — AB

## 2019-01-10 LAB — CBG MONITORING, ED: Glucose-Capillary: 224 mg/dL — ABNORMAL HIGH (ref 70–99)

## 2019-01-10 LAB — POTASSIUM: Potassium: 3.8 mmol/L (ref 3.5–5.1)

## 2019-01-10 LAB — LACTIC ACID, PLASMA: Lactic Acid, Venous: 1.2 mmol/L (ref 0.5–1.9)

## 2019-01-10 MED ORDER — SODIUM CHLORIDE 0.9 % IV SOLN
2.0000 g | Freq: Once | INTRAVENOUS | Status: AC
  Administered 2019-01-10: 2 g via INTRAVENOUS
  Filled 2019-01-10: qty 2

## 2019-01-10 MED ORDER — SODIUM CHLORIDE 0.9 % IV SOLN
1.0000 g | Freq: Two times a day (BID) | INTRAVENOUS | Status: DC
  Administered 2019-01-11: 1 g via INTRAVENOUS
  Filled 2019-01-10 (×3): qty 1

## 2019-01-10 MED ORDER — SODIUM CHLORIDE 0.9 % IV BOLUS
500.0000 mL | Freq: Once | INTRAVENOUS | Status: AC
  Administered 2019-01-10: 500 mL via INTRAVENOUS

## 2019-01-10 MED ORDER — IPRATROPIUM-ALBUTEROL 0.5-2.5 (3) MG/3ML IN SOLN
3.0000 mL | Freq: Four times a day (QID) | RESPIRATORY_TRACT | Status: DC
  Administered 2019-01-11: 3 mL via RESPIRATORY_TRACT
  Filled 2019-01-10: qty 3

## 2019-01-10 MED ORDER — VANCOMYCIN HCL IN DEXTROSE 1-5 GM/200ML-% IV SOLN
1000.0000 mg | INTRAVENOUS | Status: DC
  Administered 2019-01-12: 1000 mg via INTRAVENOUS
  Filled 2019-01-10: qty 200

## 2019-01-10 MED ORDER — SODIUM CHLORIDE 0.9 % IV SOLN
INTRAVENOUS | Status: AC
  Administered 2019-01-10 – 2019-01-11 (×2): via INTRAVENOUS

## 2019-01-10 MED ORDER — ALBUTEROL (5 MG/ML) CONTINUOUS INHALATION SOLN
10.0000 mg/h | INHALATION_SOLUTION | Freq: Once | RESPIRATORY_TRACT | Status: AC
  Administered 2019-01-10: 10 mg/h via RESPIRATORY_TRACT
  Filled 2019-01-10: qty 20

## 2019-01-10 MED ORDER — VANCOMYCIN HCL IN DEXTROSE 1-5 GM/200ML-% IV SOLN
1000.0000 mg | Freq: Once | INTRAVENOUS | Status: AC
  Administered 2019-01-10: 1000 mg via INTRAVENOUS
  Filled 2019-01-10: qty 200

## 2019-01-10 MED ORDER — IPRATROPIUM BROMIDE 0.02 % IN SOLN
0.5000 mg | Freq: Once | RESPIRATORY_TRACT | Status: AC
  Administered 2019-01-10: 0.5 mg via RESPIRATORY_TRACT
  Filled 2019-01-10: qty 2.5

## 2019-01-10 MED ORDER — ENOXAPARIN SODIUM 30 MG/0.3ML ~~LOC~~ SOLN: 30.0000 mg | SUBCUTANEOUS | Status: DC

## 2019-01-10 MED ORDER — SODIUM CHLORIDE 0.9 % IV BOLUS
1000.0000 mL | Freq: Once | INTRAVENOUS | Status: AC
  Administered 2019-01-10: 1000 mL via INTRAVENOUS

## 2019-01-10 NOTE — ED Provider Notes (Addendum)
[facility] EMERGENCY DEPARTMENT Provider Note   CSN: [id number] Arrival date & time: 01/10/19  1746    History   Chief Complaint Chief Complaint  Patient presents with  . Altered Mental Status    HPI Christine Summers is a 85 y.o. female.     HPI  85 year old female with history of DM, dementia, diabetes and stroke comes in with chief complaint of altered mental status. Level 5 caveat for altered mental status.  According to the nurses at [facility] where patient resides, patient has history of dementia but she is usually able to hold a conversation and able to feed herself.  She was last normal last night around 11 PM.  This morning they started noticing that patient was having generalized weakness and over time her symptoms progressed to her being stuporous.  EMS was called and they noted that patient was completely unresponsive.  Patient was having some wheezing and he was given breathing treatment en route to the ER.  In the ER patient is responding only to noxious stimuli.  She is noted to have hypoxia.  Patient is having generalized wheezing.  She has a most form with her that reports that she would want antibiotics, IV fluid and otherwise comfort care measures.  I discussed the case with Ms. Algernon Huxley, patient's power of attorney.  She stated that she wanted patient to be sent to the ER because they would like to figure out what is going on and to see if she can respond to therapy.  She informs me that patient has been sick for the last several weeks and has gone through multiple course of antibiotics while at the nursing home.  She also affirms with me that patient's CODE STATUS is DNR, DNI -however she does indicate that she would want antibiotics and IV fluids.  History reviewed. No pertinent past medical history.  Patient Active Problem List   Diagnosis Date Noted  . Septic shock ([place]) 01/10/2019  . HCAP (healthcare-associated pneumonia) 01/10/2019   . UTI (urinary tract infection) 01/10/2019  . Acute respiratory failure with hypoxia and hypercapnia ([place]) 01/10/2019  . AMS (altered mental status) 01/10/2019  . AKI (acute kidney injury) ([place]) 01/10/2019  . Pressure ulcer of sacral region, stage 3 ([place]) 03/02/2017  . Protein-calorie malnutrition, moderate ([place]) 03/02/2017  . OAB (overactive bladder) 11/23/2016  . Chronic constipation 07/29/2016  . Mixed Alzheimer's and vascular dementia ([place]) 06/24/2016  . Microalbuminuria due to type 2 diabetes mellitus ([place]) 03/18/2016  . Insomnia 03/18/2016  . Major depression, chronic 03/18/2016  . Type 2 diabetes mellitus with vascular disease ([place]) 01/29/2016  . Hyperlipidemia LDL goal <100 01/29/2016  . Vitamin D deficiency 01/29/2016  . Obesity 01/29/2016  . Alzheimer's disease ([place]) 01/14/2016  . Anemia of chronic disease 01/14/2016  . Hypertensive renal disease 01/14/2016  . Type 2 diabetes mellitus with stage 3 chronic kidney disease ([place]) 01/14/2016  . Absence of bladder continence 01/14/2016  . CVA, old, cognitive deficits 01/14/2016  . Senile purpura ([place]) 01/14/2016  . Postmenopausal atrophic vaginitis 01/14/2016  . Cough 01/03/2016  . Diabetes ([employer]) 01/01/2016    History reviewed. No pertinent surgical history.   OB History   No obstetric history on file.      Home Medications    Prior to Admission medications   Medication Sig Start Date End Date Taking? Authorizing Provider  acetaminophen (TYLENOL) 325 MG tablet Take 650 mg by mouth every 6 (six) hours as needed (pain).  Yes [provider]  aspirin 81 MG chewable tablet Chew 81 mg by mouth daily.   Yes [provider]  benzonatate (TESSALON) 100 MG capsule Take 100 mg by mouth 3 (three) times daily as needed for cough.   Yes [provider]  Cholecalciferol (VITAMIN D3) 2000 units TABS Take 2,000 Units by mouth daily.   Yes [provider]  docusate sodium (COLACE) 100 MG capsule  Take 100 mg by mouth 2 (two) times daily.   Yes [provider]  donepezil (ARICEPT) 10 MG tablet Take 10 mg by mouth at bedtime.   Yes [provider]  insulin glargine (LANTUS) 100 UNIT/ML injection Inject 18 Units into the skin at bedtime.    Yes [provider]  ipratropium-albuterol (DUONEB) 0.5-2.5 (3) MG/3ML SOLN Take 3 mLs by nebulization every 6 (six) hours as needed (for wheezing).    Yes [provider]  lamoTRIgine (LAMICTAL) 25 MG tablet Take 50 mg by mouth 2 (two) times daily.    Yes [provider]  Melatonin 3 MG TABS Take 3 mg by mouth at bedtime.   Yes [provider]  memantine (NAMENDA XR) 28 MG CP24 24 hr capsule Take 28 mg by mouth.   Yes [provider]  Multiple Vitamin (MULTIVITAMIN WITH MINERALS) TABS tablet Take 1 tablet by mouth daily.   Yes [provider]  oxybutynin (DITROPAN) 5 MG tablet Take 5 mg by mouth 2 (two) times daily.    Yes [provider]  polyethylene glycol (MIRALAX / GLYCOLAX) packet Take 17 g by mouth daily.   Yes [provider]  senna (SENOKOT) 8.6 MG tablet Take 1 tablet by mouth at bedtime.   Yes [provider]  vitamin B-12 (CYANOCOBALAMIN) 500 MCG tablet Take 500 mcg by mouth daily.   Yes [provider]    Family History History reviewed. No pertinent family history.  Social History Social History   Tobacco Use  . Smoking status: Unknown If Ever Smoked  . Smokeless tobacco: Never Used  Substance Use Topics  . Alcohol use: Not on file  . Drug use: Not on file     Allergies   Codeine   Review of Systems Review of Systems  Unable to perform ROS: Patient unresponsive     Physical Exam Updated Vital Signs BP (!) 76/44   Pulse (!) 102   Temp (!) 96.9 F (36.1 C) (Rectal)   Resp 17   Ht 5' (1.524 m)   Wt 76.2 kg   SpO2 99%   BMI 32.81 kg/m   Physical Exam Vitals signs and nursing note reviewed.    Constitutional:      Comments: Unresponsive  HENT:     Head: Atraumatic.  Eyes:     Pupils: Pupils are equal, round, and reactive to light.     Comments: 2 mm and equal  Cardiovascular:     Rate and Rhythm: Normal rate.  Pulmonary:     Effort: Pulmonary effort is normal.     Breath sounds: Wheezing present.  Abdominal:     Tenderness: There is no abdominal tenderness.  Skin:    General: Skin is warm.  Neurological:     Comments: Response to noxious stimuli only.  Patient has a weak gag reflex.      ED Treatments / Results  Labs (all labs ordered are listed, but only abnormal results are displayed) Labs Reviewed  COMPREHENSIVE METABOLIC PANEL - Abnormal; Notable for the following components:  Result Value   Sodium 131 (*)    Potassium 5.5 (*)    CO2 21 (*)    Glucose, Bld 210 (*)    BUN 26 (*)    Creatinine, Ser 1.37 (*)    Calcium 8.1 (*)    Total Protein 5.4 (*)    Albumin 2.6 (*)    AST 46 (*)    Total Bilirubin 1.3 (*)    GFR calc non Af Amer 34 (*)    GFR calc Af Amer 39 (*)    All other components within normal limits  CBC WITH DIFFERENTIAL/PLATELET - Abnormal; Notable for the following components:   WBC 3.8 (*)    Platelets 133 (*)    Lymphs Abs 0.6 (*)    All other components within normal limits  URINALYSIS, ROUTINE W REFLEX MICROSCOPIC - Abnormal; Notable for the following components:   Color, Urine AMBER (*)    APPearance HAZY (*)    Leukocytes,Ua TRACE (*)    Bacteria, UA MANY (*)    All other components within normal limits  INFLUENZA PANEL BY PCR (TYPE A & B) - Abnormal; Notable for the following components:   Influenza A By PCR POSITIVE (*)    All other components within normal limits  CBG MONITORING, ED - Abnormal; Notable for the following components:   Glucose-Capillary 224 (*)    All other components within normal limits  POCT I-STAT 7, (LYTES, BLD GAS, ICA,H+H) - Abnormal; Notable for the following components:   pH, Arterial 7.289  (*)    pCO2 arterial 51.9 (*)    pO2, Arterial 282.0 (*)    HCT 35.0 (*)    Hemoglobin 11.9 (*)    All other components within normal limits  CULTURE, BLOOD (ROUTINE X 2)  CULTURE, BLOOD (ROUTINE X 2)  URINE CULTURE  LACTIC ACID, PLASMA  POTASSIUM  COMPREHENSIVE METABOLIC PANEL  VITAMIN X72  TSH    EKG EKG Interpretation  Date/Time:  Tuesday January 10 2019 18:15:40 EST Ventricular Rate:  75 PR Interval:    QRS Duration: 97 QT Interval:  439 QTC Calculation: 491 R Axis:   2 Text Interpretation:  Sinus rhythm Borderline prolonged PR interval Probable anteroseptal infarct, old Borderline T abnormalities, inferior leads No acute changes No significant change since last tracing Confirmed by Varney Biles ([id number]) on 01/10/2019 8:39:19 PM   Radiology Dg Chest Port 1 View  Result Date: 01/10/2019 CLINICAL DATA:  85 year old nursing home patient presenting with acute mental status changes as she is not responding to questions as is her baseline. Hypotension upon EMS arrival. EXAM: PORTABLE CHEST 1 VIEW COMPARISON:  None. FINDINGS: Markedly suboptimal inspiration. Cardiac silhouette normal in size for technique. Thoracic aorta atherosclerotic. Asymmetric patchy opacities at the LEFT lung base. Lungs otherwise clear. Pulmonary vascularity normal. IMPRESSION: Markedly suboptimal inspiration. Atelectasis versus bronchopneumonia at the LEFT lung base. Electronically Signed   By: Evangeline Dakin M.D.   On: 01/10/2019 18:24    Procedures .Critical Care Performed by: Varney Biles, MD Authorized by: Varney Biles, MD   Critical care provider statement:    Critical care time (minutes):  92   Critical care was necessary to treat or prevent imminent or life-threatening deterioration of the following conditions:  Respiratory failure   Critical care was time spent personally by me on the following activities:  Discussions with consultants, evaluation of patient's response to treatment,  examination of patient, ordering and performing treatments and interventions, ordering and review of laboratory studies, ordering and  review of radiographic studies, pulse oximetry, re-evaluation of patient's condition, obtaining history from patient or surrogate and review of old charts   (including critical care time)  Medications Ordered in ED Medications  ceFEPIme (MAXIPIME) 1 g in sodium chloride 0.9 % 100 mL IVPB (has no administration in time range)  vancomycin (VANCOCIN) IVPB 1000 mg/200 mL premix (has no administration in time range)  0.9 %  sodium chloride infusion ( Intravenous New Bag/Given 01/10/19 2117)  ipratropium-albuterol (DUONEB) 0.5-2.5 (3) MG/3ML nebulizer solution 3 mL (3 mLs Nebulization Not Given 01/10/19 2219)  vancomycin (VANCOCIN) IVPB 1000 mg/200 mL premix (0 mg Intravenous Stopped 01/10/19 2030)  ceFEPIme (MAXIPIME) 2 g in sodium chloride 0.9 % 100 mL IVPB (0 g Intravenous Stopped 01/10/19 2002)  albuterol (PROVENTIL,VENTOLIN) solution continuous neb (10 mg/hr Nebulization Given 01/10/19 1931)  ipratropium (ATROVENT) nebulizer solution 0.5 mg (0.5 mg Nebulization Given 01/10/19 1931)  sodium chloride 0.9 % bolus 500 mL (0 mLs Intravenous Stopped 01/10/19 1943)  sodium chloride 0.9 % bolus 500 mL (0 mLs Intravenous Stopped 01/10/19 2222)  sodium chloride 0.9 % bolus 1,000 mL (1,000 mLs Intravenous New Bag/Given 01/10/19 2118)     Initial Impression / Assessment and Plan / ED Course  I have reviewed the triage vital signs and the nursing notes.  Pertinent labs & imaging results that were available during my care of the patient were reviewed by me and considered in my medical decision making (see chart for details).  Clinical Course as of Jan 10 2305  Tue Jan 10, 2019  2305 Influenza A is positive.  I informed this finding to the admitting staff.  She will go discussed the case with family.   Influenza panel by PCR (type A & B)(!) [AN]    Clinical Course User  Index [AN] Varney Biles, MD       85 year old female with history of dementia, diabetes brought into the ER from nursing home with chief complaint of altered mental status.  Patient's CODE STATUS is DNR, DNI -and per most form comfort care, however family would want IV antibiotics and IV fluids if they can help.  Patient arrives to the ER with a weak gag reflex and GCS of 6.  We called patient's POA immediately and confirmed that she is DNR, DNI.  Family expressed to [place] that they would want limited therapy including IV antibiotics and fluids that they can help.  They would also want [place] to assist in fearing out what is going on since she has been sick for the last several days -but never gotten this bad.  Currently patient is having generalized wheezing and some rales at the base.  X-ray is not showing any evidence of CHF, but she does have generalized opacity.  Patient's white count is low.  Her blood pressure is also low.  We gave her some IV antibiotics with concerns for sepsis.  Influenza screen also sent.  Ms. Algernon Huxley, pt's daughter came to the bedside and we discussed goals of care further.  She was made aware that patient's blood pressure is low and that we might have to give her some fluid, however that fluid can lead to heart failure.  She is okay with [place] giving her intermittent boluses to help sustain the blood pressure and not giving 30 cc/kg upfront.  I have given separate updates to patient's other daughter and also 2 grandkids.  We noted that patient's blood pressure drops every time fluid boluses discontinued.  I spoke with patient's  daughter again over the phone to go over the results and informing her that we will start treating patient's mother for pneumonia.  She is also made aware that patient has hypercapnic respiratory failure and that we anticipate that unless she is intubated or put on BiPAP she is going to deteriorate.  We further discussed the management of blood  pressure and fluids.  She is still in agreement with the plan to give IV fluid as needed, but she would not want her mother to get central line.  Therefore for now we will continue to give her IV fluid boluses intermittently, unless she becomes persistently hypotensive.  She is also willing to speak with palliative care in the morning.  We will call medicine for admission.  Final Clinical Impressions(s) / ED Diagnoses   Final diagnoses:  Acute hypercapnic respiratory failure ([place])  Shock ([place])  Influenza A    ED Discharge Orders    None       Varney Biles, MD 01/10/19 0623    Varney Biles, MD 01/11/19 1640

## 2019-01-10 NOTE — H&P (Addendum)
History and Physical    Christine Summers QIW:[id number] DOB: 10/21/1925 DOA: 01/10/2019  PCP: Patient, No Pcp Per Patient coming from: SNF  Chief Complaint: Altered mental status  HPI: Christine Summers is a 85 y.o. female with medical history significant of type 2 diabetes, CVA presenting from her SNF for evaluation of altered mental status.  Per SNF, patient is usually able to respond to questions.  This morning she stopped responding.  Blood pressure 76/50 by EMS.  Received 300 cc fluids and blood pressure improved to 105/80.  Patient responsive only to painful stimuli, no gag reflex.  Review of Systems: As per HPI otherwise 10 point review of systems negative.  History reviewed. No pertinent past medical history.  History reviewed. No pertinent surgical history.   has an unknown smoking status. She has never used smokeless tobacco. No history on file for alcohol and drug.  Allergies  Allergen Reactions  . Codeine Other (See Comments)    hallucinations    History reviewed. No pertinent family history.  Prior to Admission medications   Medication Sig Start Date End Date Taking? Authorizing Provider  acetaminophen (TYLENOL) 325 MG tablet Take 650 mg by mouth every 6 (six) hours as needed (pain).    [provider]  aspirin EC 81 MG tablet Take 81 mg by mouth daily.    [provider]  atorvastatin (LIPITOR) 10 MG tablet Take 10 mg by mouth at bedtime.    [provider]  Cholecalciferol (VITAMIN D3) 2000 units TABS Take 2,000 Units by mouth daily.    [provider]  dextromethorphan 15 MG/5ML syrup Take 15 mLs by mouth every 6 (six) hours as needed for cough.    [provider]  docusate sodium (COLACE) 100 MG capsule Take 100 mg by mouth 2 (two) times daily.    [provider]  donepezil (ARICEPT) 10 MG tablet Take 10 mg by mouth at bedtime.    [provider]  insulin aspart (NOVOLOG) 100 UNIT/ML injection Inject 5  Units into the skin 3 (three) times daily before meals.    [provider]  insulin glargine (LANTUS) 100 UNIT/ML injection Inject 18 Units into the skin at bedtime.     [provider]  ipratropium-albuterol (DUONEB) 0.5-2.5 (3) MG/3ML SOLN Take 3 mLs by nebulization every 6 (six) hours as needed.    [provider]  lamoTRIgine (LAMICTAL) 25 MG tablet Take 50 mg by mouth 2 (two) times daily.     [provider]  Melatonin 3 MG TABS Take 3 mg by mouth at bedtime.    [provider]  Multiple Vitamins-Minerals (DECUBI-VITE PO) Take 1 tablet by mouth daily.    [provider]  nystatin cream (MYCOSTATIN) Apply 1 application topically as needed for dry skin. Nystatin 100,000 units/GM Cream. Apply topically to groin as needed for yeast     [provider]  oxybutynin (DITROPAN) 5 MG tablet Take 1/2 tablet= 2.5 mg by mouth twice a day    [provider]  polyethylene glycol (MIRALAX / GLYCOLAX) packet Take 17 g by mouth daily.    [provider]  senna (SENOKOT) 8.6 MG tablet Take 1 tablet by mouth at bedtime.    [provider]  Skin Protectants, Misc. (EUCERIN) cream Apply 1 application topically at bedtime.    [provider]  UNABLE TO FIND Med Name: Med pass 90 mL by mouth 2 times daily    [provider]  Physical Exam: Vitals:   01/10/19 1930 01/10/19 1935 01/10/19 1945 01/10/19 2015  BP: 117/65  (!) 109/55 (!) 71/41  Pulse: 75  80 90  Resp: 15  17 16   Temp:      TempSrc:      SpO2: 99% 99% 100% 99%  Weight:  76.2 kg    Height:  5' (1.524 m)      Physical Exam  HENT:  Head: Normocephalic.  Eyes: Right eye exhibits no discharge. Left eye exhibits no discharge.  Neck: Neck supple.  Cardiovascular: Regular rhythm and intact distal pulses.  Slightly tachycardic  Pulmonary/Chest:  Coarse breath sounds appreciated upon auscultation of anterior lung fields. On 4 L supplemental  oxygen via nasal cannula  Abdominal: Soft. Bowel sounds are normal. She exhibits no distension. There is no abdominal tenderness. There is no guarding.  Musculoskeletal:        General: No edema.  Neurological:  Stuporous.  Grimaces a little with continued sternal rubs.  Skin: Skin is warm and dry. She is not diaphoretic.     Labs on Admission: I have personally reviewed following labs and imaging studies  CBC: Recent Labs  Lab 01/10/19 1915 01/10/19 1945  WBC 3.8*  --   NEUTROABS 2.6  --   HGB 12.7 11.9*  HCT 41.2 35.0*  MCV 90.2  --   PLT 133*  --    Basic Metabolic Panel: Recent Labs  Lab 01/10/19 1915 01/10/19 1945 01/10/19 2114  NA 131* 135  --   K 5.5* 3.9 3.8  CL 103  --   --   CO2 21*  --   --   GLUCOSE 210*  --   --   BUN 26*  --   --   CREATININE 1.37*  --   --   CALCIUM 8.1*  --   --    GFR: Estimated Creatinine Clearance: 24.4 mL/min (A) (by C-G formula based on SCr of 1.37 mg/dL (H)). Liver Function Tests: Recent Labs  Lab 01/10/19 1915  AST 46*  ALT 13  ALKPHOS 64  BILITOT 1.3*  PROT 5.4*  ALBUMIN 2.6*   No results for input(s): LIPASE, AMYLASE in the last 168 hours. No results for input(s): AMMONIA in the last 168 hours. Coagulation Profile: No results for input(s): INR, PROTIME in the last 168 hours. Cardiac Enzymes: No results for input(s): CKTOTAL, CKMB, CKMBINDEX, TROPONINI in the last 168 hours. BNP (last 3 results) No results for input(s): PROBNP in the last 8760 hours. HbA1C: No results for input(s): HGBA1C in the last 72 hours. CBG: Recent Labs  Lab 01/10/19 1747  GLUCAP 224*   Lipid Profile: No results for input(s): CHOL, HDL, LDLCALC, TRIG, CHOLHDL, LDLDIRECT in the last 72 hours. Thyroid Function Tests: No results for input(s): TSH, T4TOTAL, FREET4, T3FREE, THYROIDAB in the last 72 hours. Anemia Panel: No results for input(s): VITAMINB12, FOLATE, FERRITIN, TIBC, IRON, RETICCTPCT in the last 72 hours. Urine analysis:     Component Value Date/Time   COLORURINE AMBER (A) 01/10/2019 1849   APPEARANCEUR HAZY (A) 01/10/2019 1849   APPEARANCEUR Hazy 05/15/2014 1125   LABSPEC 1.020 01/10/2019 1849   LABSPEC 1.019 05/15/2014 1125   PHURINE 5.0 01/10/2019 1849   GLUCOSEU NEGATIVE 01/10/2019 1849   GLUCOSEU Negative 05/15/2014 1125   HGBUR NEGATIVE 01/10/2019 1849   BILIRUBINUR NEGATIVE 01/10/2019 1849   BILIRUBINUR Negative 05/15/2014 [place] 01/10/2019 [place] NEGATIVE 01/10/2019 1849   NITRITE NEGATIVE 01/10/2019 1849  LEUKOCYTESUR TRACE (A) 01/10/2019 1849   LEUKOCYTESUR Trace 05/15/2014 1125    Radiological Exams on Admission: Dg Chest Port 1 View  Result Date: 01/10/2019 CLINICAL DATA:  85 year old nursing home patient presenting with acute mental status changes as she is not responding to questions as is her baseline. Hypotension upon EMS arrival. EXAM: PORTABLE CHEST 1 VIEW COMPARISON:  None. FINDINGS: Markedly suboptimal inspiration. Cardiac silhouette normal in size for technique. Thoracic aorta atherosclerotic. Asymmetric patchy opacities at the LEFT lung base. Lungs otherwise clear. Pulmonary vascularity normal. IMPRESSION: Markedly suboptimal inspiration. Atelectasis versus bronchopneumonia at the LEFT lung base. Electronically Signed   By: Evangeline Dakin M.D.   On: 01/10/2019 18:24    EKG: Independently reviewed.  Sinus rhythm, ST changes similar to prior tracing.  Assessment/Plan Principal Problem:   Septic shock (HCC) Active Problems:   HCAP (healthcare-associated pneumonia)   UTI (urinary tract infection)   Acute respiratory failure with hypoxia and hypercapnia (HCC)   AMS (altered mental status)   AKI (acute kidney injury) ([place])   Septic shock secondary to suspected healthcare associated pneumonia and possible UTI -Tachycardic.  Blood pressure 76/50 by EMS.  So far, patient has received close to 1 L fluid boluses and continues to be hypotensive with most  recent blood pressure 71/41. -Afebrile.  White count 3.8.  Lactic acid normal. -UA with trace leukocytes, 6-10 WBCs, and many bacteria, and negative nitrite. -Chest x-ray with atelectasis versus bronchopneumonia at the left lung base. -Continue IV fluid resuscitation -Continue broad-spectrum antibiotics including vancomycin and cefepime -Blood culture x2 -Urine culture  -Influenza panel pending -Continue to monitor hemodynamics  Addendum: Influenza A positive.  Went back to reassess the patient.  She is currently awake and alert.  She is able to tell me her name and is able to recognize her daughters at bedside and is laughing.  Blood pressure improved with IV fluid.  Creatinine clearance 24.  Start Tamiflu 30 mg daily.  Aspiration precautions.  Acute hypoxic hypercapnic respiratory failure secondary to suspected healthcare associated pneumonia -Currently on 4 L supplemental oxygen.  Bicarb 21.  ABG showing pH 7.28, PCO2 51, and PO2 282.  Patient is stuporous and not a good candidate for NIPPV.  DNR/DNI. -Continue treatment for pneumonia as mentioned above -DuoNebs every 6 hours -Continue supplemental oxygen -Influenza panel pending  Altered mental status -Likely related to infection and decreased cerebral perfusion from hypotension.  Patient is stuporous.  Will hold off ordering head CT as family wants limited scope of treatment which includes only labs, antibiotics, and IV fluid. -Continue treatment for infection as mentioned above -Check B12, TSH -Continue to monitor -Keep n.p.o.; aspiration precautions  AKI BUN 26, creatinine 1.3, baseline 0.8-1.1 a year ago.  Likely prerenal from sepsis/hypotension. -IV fluid resuscitation -Avoid nephrotoxic agents/contrast -Continue to monitor renal function -Monitor urine output  Mildly elevated LFTs Likely related to septic shock.  AST borderline elevated at 46 and T bili 1.3.  Remainder of LFTs normal. -Continue to monitor LFTs  Chronic  thrombocytopenia Platelet count 133,000.  No signs of active bleeding. -Continue to monitor  Type 2 diabetes -CBG 210.  Will hold off insulin as patient is not able to eat.  Continue CBG checks q6 hrs.   ?Hyperkalemia -Potassium 5.5, sample hemolyzed. -Repeat labs  Goals of care discussion Per MOST form, patient is DNR, should be given antibiotics and fluids if indicated, and no feeding tube.  I had a goals of care discussion with patient's daughter and healthcare power of  attorney Algernon Huxley over the phone and her sister at bedside.  Family wants the patient to be comfortable but continued on IV fluid and antibiotics.  I expressed my concern that patient's blood pressure has not responded to IV fluid boluses so far.  Family understands that the patient is critically ill and possibly might not survive this hospitalization.  They understand that if the patient continues to be hypotensive despite continued IV fluid boluses, giving antibiotics and additional fluid might be futile. However, they want these treatments to be continued as they do not want the patient to be full comfort care.  If patient's blood pressure does not respond to IV aggressive fluid resuscitation, family does not want central line and pressors.  They do not want CPR, NIPPV, intubation, or mechanical ventilation.  They do want labs to be drawn. -Palliative care consult  DVT prophylaxis: SCDs Code Status: DNR/DNI Family Communication: Spoke to patient's daughter and HCPOA Algernon Huxley and her other daughter as well. Consults called: None Admission status: It is my clinical opinion that admission to INPATIENT is reasonable and necessary in this 85 y.o. female . presenting with symptoms of hypotension, altered mental status, concerning for septic shock secondary to suspected pneumonia and UTI  . with pertinent positives on physical exam including: Stuporous . and pertinent positives on radiographic and laboratory data  including: Evidence of possible pneumonia on imaging and UTI on labs. . Workup and treatment include IV fluid resuscitation and IV antibiotics.  Given the aforementioned, the predictability of an adverse outcome is felt to be significant. I expect that the patient will require at least 2 midnights in the hospital to treat this condition.    Shela Leff MD [facility] Pager [phone]  If 7PM-7AM, please contact night-coverage [email or web address] Password [facility]  01/10/2019, 10:01 PM

## 2019-01-10 NOTE — ED Notes (Signed)
Pt responsive to pain

## 2019-01-10 NOTE — ED Notes (Signed)
Pt was responsive to pain.

## 2019-01-10 NOTE — ED Notes (Signed)
EDP notified on pt.'s hypotension , NS 500 ml bolus infusing .

## 2019-01-10 NOTE — ED Notes (Signed)
Pt's HPOA (Dtr) sent to Pt's Rm

## 2019-01-10 NOTE — ED Notes (Signed)
MD at bedside. 

## 2019-01-10 NOTE — Progress Notes (Signed)
Pharmacy Antibiotic Note  Christine Summers is a 85 y.o. female admitted on 01/10/2019 with sepsis.    Plan: Cefepime 2 g x 1 then 1 g q24h Vanc 1 g q36 Monitor renal fx cx vanc lvls prn  Height: 5' (152.4 cm) Weight: 168 lb (76.2 kg) IBW/kg (Calculated) : 45.5  Temp (24hrs), Avg:96.9 F (36.1 C), Min:96.9 F (36.1 C), Max:96.9 F (36.1 C)  Recent Labs  Lab 01/10/19 1915  WBC 3.8*  CREATININE 1.37*    Estimated Creatinine Clearance: 24.4 mL/min (A) (by C-G formula based on SCr of 1.37 mg/dL (H)).    Allergies  Allergen Reactions  . Codeine    Vancomycin 1000 mg IV Q 36 hrs. Goal AUC 400-550. Expected AUC: 565 SCr used: 1.37  Levester Fresh, PharmD, BCPS, BCCCP Clinical Pharmacist [phone]  Please check AMION for all [place] numbers  01/10/2019 8:38 PM

## 2019-01-10 NOTE — ED Notes (Addendum)
Assumed care on pt. , pt. receiving continuous  nebulizer treatment , NS IV bolus infusing , Vancomycin and Maxipime IV antibiotics infusing after blood cultures were collected , RT at bedside collecting blood specimen for ABG , IV sites intact , family at bedside , waiting for admitting MD .

## 2019-01-10 NOTE — ED Notes (Signed)
Pt lying with eyes closed .does not respond to voice or touch. Only minimal response to vigorous sternal rub.

## 2019-01-10 NOTE — ED Notes (Signed)
Dr. Marlowe Sax ( admitting MD ) explained pt. condition, prognosis and plan of care  to family . Patient remained hypotensive and unresponsive .

## 2019-01-10 NOTE — ED Triage Notes (Signed)
Pt here by EMS for acute AMS. Pt from SNF with DNR, most form here as well.  Staff states she's usually able to respond to questions.  This morning she stopped responding.  EMS BP initially was 76/50 after 300CC of fluids BP 105/80.  Responsive to sternal rub then falls back to sleep, no gag reflex.

## 2019-01-11 DIAGNOSIS — R6521 Severe sepsis with septic shock: Secondary | ICD-10-CM

## 2019-01-11 DIAGNOSIS — A419 Sepsis, unspecified organism: Secondary | ICD-10-CM

## 2019-01-11 LAB — COMPREHENSIVE METABOLIC PANEL
ALBUMIN: 2.7 g/dL — AB (ref 3.5–5.0)
ALT: 18 U/L (ref 0–44)
AST: 40 U/L (ref 15–41)
Alkaline Phosphatase: 63 U/L (ref 38–126)
Anion gap: 14 (ref 5–15)
BILIRUBIN TOTAL: 0.4 mg/dL (ref 0.3–1.2)
BUN: 21 mg/dL (ref 8–23)
CO2: 17 mmol/L — ABNORMAL LOW (ref 22–32)
Calcium: 8 mg/dL — ABNORMAL LOW (ref 8.9–10.3)
Chloride: 105 mmol/L (ref 98–111)
Creatinine, Ser: 1.37 mg/dL — ABNORMAL HIGH (ref 0.44–1.00)
GFR calc Af Amer: 39 mL/min — ABNORMAL LOW (ref >60)
GFR calc non Af Amer: 34 mL/min — ABNORMAL LOW (ref >60)
GLUCOSE: 226 mg/dL — AB (ref 70–99)
Potassium: 3.9 mmol/L (ref 3.5–5.1)
Sodium: 136 mmol/L (ref 135–145)
Total Protein: 5.9 g/dL — ABNORMAL LOW (ref 6.5–8.1)

## 2019-01-11 LAB — GLUCOSE, CAPILLARY
GLUCOSE-CAPILLARY: 100 mg/dL — AB (ref 70–99)
Glucose-Capillary: 77 mg/dL (ref 70–99)

## 2019-01-11 LAB — CBG MONITORING, ED
GLUCOSE-CAPILLARY: 183 mg/dL — AB (ref 70–99)
Glucose-Capillary: 209 mg/dL — ABNORMAL HIGH (ref 70–99)
Glucose-Capillary: 140 mg/dL — ABNORMAL HIGH (ref 70–99)

## 2019-01-11 LAB — VITAMIN B12: Vitamin B-12: 1201 pg/mL — ABNORMAL HIGH (ref 180–914)

## 2019-01-11 LAB — MRSA PCR SCREENING: MRSA by PCR: POSITIVE — AB

## 2019-01-11 LAB — TSH: TSH: 2.28 u[IU]/mL (ref 0.350–4.500)

## 2019-01-11 MED ORDER — INSULIN ASPART 100 UNIT/ML ~~LOC~~ SOLN
0.0000 [IU] | Freq: Three times a day (TID) | SUBCUTANEOUS | Status: DC
  Administered 2019-01-12: 1 [IU] via SUBCUTANEOUS
  Administered 2019-01-12: 2 [IU] via SUBCUTANEOUS
  Administered 2019-01-13 (×2): 1 [IU] via SUBCUTANEOUS
  Administered 2019-01-13 – 2019-01-14 (×3): 2 [IU] via SUBCUTANEOUS
  Administered 2019-01-15: 9 [IU] via SUBCUTANEOUS
  Administered 2019-01-15: 3 [IU] via SUBCUTANEOUS
  Administered 2019-01-15: 1 [IU] via SUBCUTANEOUS
  Administered 2019-01-16: 2 [IU] via SUBCUTANEOUS

## 2019-01-11 MED ORDER — SODIUM CHLORIDE 0.9 % IV SOLN
1.0000 g | INTRAVENOUS | Status: DC
  Administered 2019-01-12: 1 g via INTRAVENOUS
  Filled 2019-01-11: qty 1

## 2019-01-11 MED ORDER — MORPHINE SULFATE (PF) 2 MG/ML IV SOLN
1.0000 mg | Freq: Once | INTRAVENOUS | Status: DC
  Filled 2019-01-11: qty 1

## 2019-01-11 MED ORDER — SODIUM CHLORIDE 0.9 % IV BOLUS
500.0000 mL | Freq: Once | INTRAVENOUS | Status: AC
  Administered 2019-01-11: 500 mL via INTRAVENOUS

## 2019-01-11 MED ORDER — SODIUM CHLORIDE 0.9 % IV BOLUS: 250.0000 mL | Freq: Once | INTRAVENOUS | Status: DC

## 2019-01-11 MED ORDER — HEPARIN SODIUM (PORCINE) 5000 UNIT/ML IJ SOLN
5000.0000 [IU] | Freq: Three times a day (TID) | INTRAMUSCULAR | Status: DC
  Administered 2019-01-11 – 2019-01-16 (×15): 5000 [IU] via SUBCUTANEOUS
  Filled 2019-01-11 (×14): qty 1

## 2019-01-11 MED ORDER — IPRATROPIUM BROMIDE 0.02 % IN SOLN: 0.5000 mg | Freq: Three times a day (TID) | RESPIRATORY_TRACT | Status: DC

## 2019-01-11 MED ORDER — LEVALBUTEROL HCL 0.63 MG/3ML IN NEBU: 0.6300 mg | INHALATION_SOLUTION | Freq: Three times a day (TID) | RESPIRATORY_TRACT | Status: DC

## 2019-01-11 MED ORDER — MORPHINE SULFATE (PF) 2 MG/ML IV SOLN
0.5000 mg | Freq: Once | INTRAVENOUS | Status: AC
  Administered 2019-01-11: 0.5 mg via INTRAVENOUS

## 2019-01-11 MED ORDER — OSELTAMIVIR PHOSPHATE 30 MG PO CAPS
30.0000 mg | ORAL_CAPSULE | Freq: Every day | ORAL | Status: DC
  Administered 2019-01-11 – 2019-01-12 (×2): 30 mg via ORAL
  Filled 2019-01-11 (×2): qty 1

## 2019-01-11 MED ORDER — SODIUM CHLORIDE 0.9 % IV SOLN
INTRAVENOUS | Status: DC
  Administered 2019-01-11: 17:00:00 via INTRAVENOUS

## 2019-01-11 MED ORDER — OSELTAMIVIR PHOSPHATE 30 MG PO CAPS: 30.0000 mg | ORAL_CAPSULE | Freq: Every day | ORAL | Status: DC

## 2019-01-11 MED ORDER — DM-GUAIFENESIN ER 30-600 MG PO TB12
1.0000 | ORAL_TABLET | Freq: Two times a day (BID) | ORAL | Status: DC
  Administered 2019-01-11 – 2019-01-16 (×12): 1 via ORAL
  Filled 2019-01-11 (×12): qty 1

## 2019-01-11 MED ORDER — INSULIN ASPART 100 UNIT/ML ~~LOC~~ SOLN: 0.0000 [IU] | SUBCUTANEOUS | Status: DC

## 2019-01-11 NOTE — Progress Notes (Signed)
Patient has removed one of her IV lines. Continues to remove nasal cannula, cardiac leads  and pulse ox after being redirected and educated.  Patient has removed the safety mittens as well.  Notified Lamar Blinks, NP .  Will continue to monitor the patient and notify as needed

## 2019-01-11 NOTE — ED Notes (Signed)
Daughters  decided not to insert NGT to patient , EDP notified .

## 2019-01-11 NOTE — Progress Notes (Signed)
Palliative Medicine Team consult was received.   Chart reviewed and met briefly with patient and her family.  Unresponsive at time of arrival, but now awake, alert, and at baseline mental status.    Care plan seems clear with established limits and completed MOST form.  Will plan for follow-up in 24-48 hours once clinical course is more clear.  Family has my card and will call with questions.  If there are urgent needs or questions please call [phone]. Thank you for consulting out team to assist with this patients care.  Micheline Rough, MD [facility] Team [phone]  NO CHARGE NOTE

## 2019-01-11 NOTE — Progress Notes (Signed)
Inpatient Diabetes Program Recommendations  AACE/ADA: New Consensus Statement on Inpatient Glycemic Control (2015)  Target Ranges:  Prepandial:   less than 140 mg/dL      Peak postprandial:   less than 180 mg/dL (1-2 hours)      Critically ill patients:  140 - 180 mg/dL   Results for PEARLENA, OW (MRN [id number]) as of 01/11/2019 09:29  Ref. Range 01/10/2019 17:47 01/11/2019 01:38 01/11/2019 06:37  Glucose-Capillary Latest Ref Range: 70 - 99 mg/dL 224 (H) 209 (H) 183 (H)   Review of Glycemic Control  Diabetes history: DM 2 Outpatient Diabetes medications: Lantus 18 units, Novolog 5 units tid Current orders for Inpatient glycemic control: None  Inpatient Diabetes Program Recommendations:    Consider Novolog 0-9 units Q6 hours coinciding with CBG checks as ordered per admitting MD.  Thanks, Tama Headings RN, MSN, BC-ADM Inpatient Diabetes Coordinator Team Pager [phone] (8a-5p)

## 2019-01-11 NOTE — Progress Notes (Signed)
PROGRESS NOTE    Christine Summers  UDJ:[id number] DOB: [birth year] DOA: 01/10/2019 PCP: Patient, No Pcp Per     Brief Narrative:  Christine Summers is a 85 y.o. female with medical history significant of type 2 diabetes, history of CVA presenting from her SNF for evaluation of altered mental status.  Per SNF, patient is usually able to respond to questions.  This morning, she stopped responding.  Blood pressure 76/50 by EMS.  Received 300 cc fluids and blood pressure improved to 105/80.  Patient responsive only to painful stimuli, no gag reflex.  She was admitted for septic shock secondary to healthcare associated pneumonia, influenza A.  In the ED, they attempted to place NG tube for medication administration, afterward, patient became much more awake and alert.  Blood pressure improved with IV fluids.  New events last 24 hours / Subjective: Patient's grandson and his fiance at bedside.  He states that patient's mentation is back to her baseline.  Asking if she is able to drink fluids due to dry mouth.  She denies any complaints such as shortness of breath, chest pain or abdominal pain.  They admit that she has had a cough for about a month at SNF.  Assessment & Plan:   Principal Problem:   Septic shock ([place]) Active Problems:   HCAP (healthcare-associated pneumonia)   UTI (urinary tract infection)   Acute respiratory failure with hypoxia and hypercapnia (HCC)   AMS (altered mental status)   AKI (acute kidney injury) ([place])   Septic shock secondary to HCAP, influenza A -Blood pressure much improved with IV fluids -Blood cultures pending -MRSA PCR pending -Continue vancomycin, cefepime, Tamiflu -Continue IV fluids  Acute metabolic encephalopathy -Patient was found to be very somnolent on presentation, now resolved and back to baseline per family members  Acute hypoxemic, hypercapnic respiratory failure -Currently requiring 4L nasal cannula O2  Acute kidney injury -Baseline  creatinine 0.8-1.1 -Continue IV fluids  Type 2 diabetes -Sliding scale insulin  Hyperkalemia -Resolved  Dementia -Reported by family   DVT prophylaxis: Subcutaneous heparin Code Status: DNR Family Communication: Grandson and his fiance at bedside Disposition Plan: Pending improvement of sepsis pathology   Consultants:   Palliative care medicine  Procedures:   None  Antimicrobials:  Anti-infectives (From admission, onward)   Start     Dose/Rate Route Frequency Ordered Stop   01/12/19 0600  vancomycin (VANCOCIN) IVPB 1000 mg/200 mL premix     1,000 mg 200 mL/hr over 60 Minutes Intravenous Every 36 hours 01/10/19 2037     01/11/19 1000  oseltamivir (TAMIFLU) capsule 30 mg  Status:  Discontinued     30 mg Per NG tube Daily 01/11/19 0054 01/11/19 0102   01/11/19 0600  ceFEPIme (MAXIPIME) 1 g in sodium chloride 0.9 % 100 mL IVPB     1 g 200 mL/hr over 30 Minutes Intravenous Every 12 hours 01/10/19 2037     01/11/19 0130  oseltamivir (TAMIFLU) capsule 30 mg     30 mg Oral Daily 01/11/19 0102 01/16/19 0959   01/10/19 1845  vancomycin (VANCOCIN) IVPB 1000 mg/200 mL premix     1,000 mg 200 mL/hr over 60 Minutes Intravenous  Once 01/10/19 1840 01/10/19 2030   01/10/19 1845  ceFEPIme (MAXIPIME) 2 g in sodium chloride 0.9 % 100 mL IVPB     2 g 200 mL/hr over 30 Minutes Intravenous  Once 01/10/19 1840 01/10/19 2002       Objective: Vitals:   01/11/19 0930 01/11/19 1000  01/11/19 1030 01/11/19 1138  BP: 114/63 108/62 100/66 98/63  Pulse: (!) 112 (!) 110 (!) 105 95  Resp: (!) 31 (!) 30 (!) 29 (!) 33  Temp:      TempSrc:      SpO2: 98% 97% 97% 99%  Weight:      Height:        Intake/Output Summary (Last 24 hours) at 01/11/2019 1209 Last data filed at 01/10/2019 1753 Gross per 24 hour  Intake 300 ml  Output -  Net 300 ml   Filed Weights   01/10/19 1935  Weight: 76.2 kg    Examination:  General exam: Appears calm and comfortable  ENT: Dry mucosa Respiratory  system: Coarse breath sounds bilaterally, on nasal cannula O2 Cardiovascular system: S1 & S2 heard, RRR. No JVD, murmurs, rubs, gallops or clicks. No pedal edema.  Gastrointestinal system: Abdomen is nondistended, soft and nontender. No organomegaly or masses felt. Normal bowel sounds heard. Central nervous system: Alert and oriented. No focal neurological deficits. Extremities: Symmetric  Skin: No rashes, lesions or ulcers Psychiatry: Judgement and insight appear stable  Data Reviewed: I have personally reviewed following labs and imaging studies  CBC: Recent Labs  Lab 01/10/19 1915 01/10/19 1945  WBC 3.8*  --   NEUTROABS 2.6  --   HGB 12.7 11.9*  HCT 41.2 35.0*  MCV 90.2  --   PLT 133*  --    Basic Metabolic Panel: Recent Labs  Lab 01/10/19 1915 01/10/19 1945 01/10/19 2114 01/11/19 0213  NA 131* 135  --  136  K 5.5* 3.9 3.8 3.9  CL 103  --   --  105  CO2 21*  --   --  17*  GLUCOSE 210*  --   --  226*  BUN 26*  --   --  21  CREATININE 1.37*  --   --  1.37*  CALCIUM 8.1*  --   --  8.0*   GFR: Estimated Creatinine Clearance: 24.4 mL/min (A) (by C-G formula based on SCr of 1.37 mg/dL (H)). Liver Function Tests: Recent Labs  Lab 01/10/19 1915 01/11/19 0213  AST 46* 40  ALT 13 18  ALKPHOS 64 63  BILITOT 1.3* 0.4  PROT 5.4* 5.9*  ALBUMIN 2.6* 2.7*   No results for input(s): LIPASE, AMYLASE in the last 168 hours. No results for input(s): AMMONIA in the last 168 hours. Coagulation Profile: No results for input(s): INR, PROTIME in the last 168 hours. Cardiac Enzymes: No results for input(s): CKTOTAL, CKMB, CKMBINDEX, TROPONINI in the last 168 hours. BNP (last 3 results) No results for input(s): PROBNP in the last 8760 hours. HbA1C: No results for input(s): HGBA1C in the last 72 hours. CBG: Recent Labs  Lab 01/10/19 1747 01/11/19 0138 01/11/19 0637 01/11/19 1206  GLUCAP 224* 209* 183* 140*   Lipid Profile: No results for input(s): CHOL, HDL, LDLCALC,  TRIG, CHOLHDL, LDLDIRECT in the last 72 hours. Thyroid Function Tests: Recent Labs    01/11/19 0212  TSH 2.280   Anemia Panel: Recent Labs    01/11/19 0213  VITAMINB12 1,201*   Sepsis Labs: Recent Labs  Lab 01/10/19 1915  LATICACIDVEN 1.2    Recent Results (from the past 240 hour(s))  Blood Culture (routine x 2)     Status: None (Preliminary result)   Collection Time: 01/10/19  7:15 PM  Result Value Ref Range Status   Specimen Description BLOOD RIGHT ANTECUBITAL  Final   Special Requests   Final  BOTTLES DRAWN AEROBIC AND ANAEROBIC Blood Culture results may not be optimal due to an inadequate volume of blood received in culture bottles   Culture   Final    NO GROWTH < 12 HOURS Performed at [facility] [address], [place], [place] [address]    Report Status PENDING  Incomplete  Blood Culture (routine x 2)     Status: None (Preliminary result)   Collection Time: 01/10/19  7:20 PM  Result Value Ref Range Status   Specimen Description BLOOD RIGHT HAND  Final   Special Requests   Final    BOTTLES DRAWN AEROBIC ONLY Blood Culture results may not be optimal due to an inadequate volume of blood received in culture bottles   Culture   Final    NO GROWTH < 12 HOURS Performed at [facility], [place] [address], [place], [place] [address]    Report Status PENDING  Incomplete       Radiology Studies: Dg Chest [place] 1 View  Result Date: 01/10/2019 CLINICAL DATA:  84 year old nursing home patient presenting with acute mental status changes as she is not responding to questions as is her baseline. Hypotension upon EMS arrival. EXAM: PORTABLE CHEST 1 VIEW COMPARISON:  None. FINDINGS: Markedly suboptimal inspiration. Cardiac silhouette normal in size for technique. Thoracic aorta atherosclerotic. Asymmetric patchy opacities at the LEFT lung base. Lungs otherwise clear. Pulmonary vascularity normal. IMPRESSION: Markedly suboptimal inspiration. Atelectasis versus  bronchopneumonia at the LEFT lung base. Electronically Signed   By: Evangeline Dakin M.D.   On: 01/10/2019 18:24      Scheduled Meds: . dextromethorphan-guaiFENesin  1 tablet Oral BID  . heparin injection (subcutaneous)  5,000 Units Subcutaneous Q8H  . insulin aspart  0-9 Units Subcutaneous TID WC  . oseltamivir  30 mg Oral Daily   Continuous Infusions: . sodium chloride    . ceFEPime (MAXIPIME) IV Stopped (01/11/19 7903)  . [START ON 01/12/2019] vancomycin       LOS: 1 day    Time spent: 40 minutes   Dessa Phi, DO [facility] [email or web address] 01/11/2019, 12:09 PM

## 2019-01-11 NOTE — ED Notes (Signed)
Pt can eat and drink per Dr. Maylene Roes

## 2019-01-11 NOTE — Progress Notes (Signed)
Pharmacy Antibiotic Note  Christine Summers is a 85 y.o. female admitted on 01/10/2019 with sepsis.  Pharmacy dosing cefepime and vancomycin  -SCr= 1.37, CrCl ~ 25 -cultures: ngtd  Plan: Change cefepime to 1gm IV q24h Continue vancomycin 1gm IV q36hr   Height: 5' (152.4 cm) Weight: 168 lb (76.2 kg) IBW/kg (Calculated) : 45.5  Temp (24hrs), Avg:97.2 F (36.2 C), Min:96.9 F (36.1 C), Max:97.7 F (36.5 C)  Recent Labs  Lab 01/10/19 1915 01/11/19 0213  WBC 3.8*  --   CREATININE 1.37* 1.37*  LATICACIDVEN 1.2  --     Estimated Creatinine Clearance: 24.4 mL/min (A) (by C-G formula based on SCr of 1.37 mg/dL (H)).    Allergies  Allergen Reactions  . Codeine Other (See Comments)    hallucinations   Vancomycin 1000 mg IV Q 36 hrs. Goal AUC 400-550. Expected AUC: 565 SCr used: 1.37  Hildred Laser, PharmD Clinical Pharmacist **Pharmacist phone directory can now be found on [email or web address] ([email or web address]).  Listed under [place].

## 2019-01-11 NOTE — ED Notes (Signed)
Dr. Marlowe Sax ( admitting MD ) notified on patient's persistent productive cough and tachycardia .

## 2019-01-11 NOTE — ED Notes (Signed)
Secretary Levada Dy calling for new hospital bed for pt in 28.

## 2019-01-12 DIAGNOSIS — J101 Influenza due to other identified influenza virus with other respiratory manifestations: Secondary | ICD-10-CM

## 2019-01-12 DIAGNOSIS — R4182 Altered mental status, unspecified: Secondary | ICD-10-CM

## 2019-01-12 DIAGNOSIS — N179 Acute kidney failure, unspecified: Secondary | ICD-10-CM

## 2019-01-12 DIAGNOSIS — J9602 Acute respiratory failure with hypercapnia: Secondary | ICD-10-CM

## 2019-01-12 DIAGNOSIS — J9601 Acute respiratory failure with hypoxia: Secondary | ICD-10-CM

## 2019-01-12 LAB — BASIC METABOLIC PANEL
Anion gap: 6 (ref 5–15)
BUN: 13 mg/dL (ref 8–23)
CO2: 21 mmol/L — ABNORMAL LOW (ref 22–32)
Calcium: 7.5 mg/dL — ABNORMAL LOW (ref 8.9–10.3)
Chloride: 109 mmol/L (ref 98–111)
Creatinine, Ser: 1.02 mg/dL — ABNORMAL HIGH (ref 0.44–1.00)
GFR calc Af Amer: 56 mL/min — ABNORMAL LOW (ref >60)
GFR calc non Af Amer: 48 mL/min — ABNORMAL LOW (ref >60)
Glucose, Bld: 100 mg/dL — ABNORMAL HIGH (ref 70–99)
Potassium: 4 mmol/L (ref 3.5–5.1)
SODIUM: 136 mmol/L (ref 135–145)

## 2019-01-12 LAB — CBC
HCT: 37.3 % (ref 36.0–46.0)
Hemoglobin: 11.5 g/dL — ABNORMAL LOW (ref 12.0–15.0)
MCH: 27.6 pg (ref 26.0–34.0)
MCHC: 30.8 g/dL (ref 30.0–36.0)
MCV: 89.7 fL (ref 80.0–100.0)
Platelets: 130 10*3/uL — ABNORMAL LOW (ref 150–400)
RBC: 4.16 MIL/uL (ref 3.87–5.11)
RDW: 13.8 % (ref 11.5–15.5)
WBC: 4.5 10*3/uL (ref 4.0–10.5)
nRBC: 0 % (ref 0.0–0.2)

## 2019-01-12 LAB — GLUCOSE, CAPILLARY
GLUCOSE-CAPILLARY: 123 mg/dL — AB (ref 70–99)
Glucose-Capillary: 114 mg/dL — ABNORMAL HIGH (ref 70–99)
Glucose-Capillary: 151 mg/dL — ABNORMAL HIGH (ref 70–99)
Glucose-Capillary: 170 mg/dL — ABNORMAL HIGH (ref 70–99)
Glucose-Capillary: 134 mg/dL — ABNORMAL HIGH (ref 70–99)

## 2019-01-12 MED ORDER — OSELTAMIVIR PHOSPHATE 30 MG PO CAPS
30.0000 mg | ORAL_CAPSULE | Freq: Two times a day (BID) | ORAL | Status: AC
  Administered 2019-01-12 – 2019-01-15 (×7): 30 mg via ORAL
  Filled 2019-01-12 (×7): qty 1

## 2019-01-12 MED ORDER — CHLORHEXIDINE GLUCONATE CLOTH 2 % EX PADS
6.0000 | MEDICATED_PAD | Freq: Every day | CUTANEOUS | Status: AC
  Administered 2019-01-12 – 2019-01-16 (×5): 6 via TOPICAL

## 2019-01-12 MED ORDER — MUPIROCIN 2 % EX OINT
1.0000 "application " | TOPICAL_OINTMENT | Freq: Two times a day (BID) | CUTANEOUS | Status: DC
  Administered 2019-01-12 – 2019-01-16 (×9): 1 via NASAL
  Filled 2019-01-12 (×2): qty 22

## 2019-01-12 NOTE — Progress Notes (Signed)
Patient ID: Christine Summers, female   DOB: 22-Feb-1925, 85 y.o.   MRN: [id number]  PROGRESS NOTE    Christine Summers  NOB:[id number] DOB: December 05, 1924 DOA: 01/10/2019 PCP: Patient, No Pcp Per   Brief Narrative:  85 year old female with history of diabetes mellitus type 2, CVA presented from her SNF for evaluation of altered mental status.  Patient was initially hypotensive which improved with IV fluids.  She is admitted for pneumonia with influenza A and started on antibiotics, IV fluids.  Patient was initially very minimally responsive but later on became more responsive.  Assessment & Plan:   Principal Problem:   Septic shock ([place]) Active Problems:   HCAP (healthcare-associated pneumonia)   UTI (urinary tract infection)   Acute respiratory failure with hypoxia and hypercapnia (HCC)   AMS (altered mental status)   AKI (acute kidney injury) ([place])  Sepsis probably from influenza A -Treated with IV fluids.  Blood pressure has improved.  Blood cultures have been negative.  Urine is growing E. coli but is probably a colonizer -Patient was started on broad-spectrum antibiotics as well.  Will discontinue antibiotics and only treat with Tamiflu for a total of 5 days.  We will add prednisone 40 mg daily for 5 days. -Doubt that patient has bacterial HCAP  Acute metabolic encephalopathy in a patient with history of dementia  -Patient was somnolent on admission.  Now probably back to her baseline. -Monitor mental status.  Patient apparently removed her IV lines, nasal cannula, cardiac leads overnight. -We will discontinue telemetry monitoring.  Remove IV line.  Stop IV fluids.    Acute hypoxic respiratory failure -Probably secondary to influenza.  Currently on 2 L oxygen via nasal cannula.  Wean off as able.  Thrombocytopenia -Questionable cause.  Monitor.  No signs of bleeding  Acute kidney injury  -Baseline creatinine 0.8-1.1.  Creatinine has improved.  DC IV fluids  Diabetes mellitus type  2 -Sliding scale insulin  Dementia -Fall precaution.  Delirium precautions   DVT prophylaxis: Subcutaneous heparin Code Status: DNR Family Communication: None at bedside Disposition Plan: SNF once clinically stable  Consultants: None  Procedures: None  Antimicrobials: Vancomycin and cefepime 01/10/2019-01/11/2019 Tamiflu from 01/10/2019 onwards   Subjective: Patient seen and examined at bedside.  She is awake but is confused.  Nursing staff reported that patient was confused and trying to pull out her IV line, nasal cannula overnight.  No overnight fever or vomiting.  Objective: Vitals:   01/11/19 2254 01/12/19 0247 01/12/19 0448 01/12/19 0909  BP: (!) 104/57 119/63 113/60 124/72  Pulse: 87 85 79 87  Resp: (!) 26 (!) 24 (!) 24 20  Temp:  98.6 F (37 C)  97.7 F (36.5 C)  TempSrc:    Oral  SpO2: 98% 100% 99% 92%  Weight:      Height:        Intake/Output Summary (Last 24 hours) at 01/12/2019 1150 Last data filed at 01/12/2019 0909 Gross per 24 hour  Intake 370.58 ml  Output -  Net 370.58 ml   Filed Weights   01/10/19 1935  Weight: 76.2 kg    Examination:  General exam: Appears calm and comfortable.  Elderly female.  Confused. Respiratory system: Bilateral decreased breath sounds at bases, some scattered crackles.  Tachypneic Cardiovascular system: S1 & S2 heard, Rate controlled Gastrointestinal system: Abdomen is nondistended, soft and nontender. Normal bowel sounds heard. Extremities: No cyanosis, clubbing, edema    Data Reviewed: I have personally reviewed following labs and imaging studies  CBC: Recent Labs  Lab 01/10/19 1915 01/10/19 1945 01/12/19 0505  WBC 3.8*  --  4.5  NEUTROABS 2.6  --   --   HGB 12.7 11.9* 11.5*  HCT 41.2 35.0* 37.3  MCV 90.2  --  89.7  PLT 133*  --  563*   Basic Metabolic Panel: Recent Labs  Lab 01/10/19 1915 01/10/19 1945 01/10/19 2114 01/11/19 0213 01/12/19 0505  NA 131* 135  --  136 136  K 5.5* 3.9 3.8 3.9 4.0   CL 103  --   --  105 109  CO2 21*  --   --  17* 21*  GLUCOSE 210*  --   --  226* 100*  BUN 26*  --   --  21 13  CREATININE 1.37*  --   --  1.37* 1.02*  CALCIUM 8.1*  --   --  8.0* 7.5*   GFR: Estimated Creatinine Clearance: 32.8 mL/min (A) (by C-G formula based on SCr of 1.02 mg/dL (H)). Liver Function Tests: Recent Labs  Lab 01/10/19 1915 01/11/19 0213  AST 46* 40  ALT 13 18  ALKPHOS 64 63  BILITOT 1.3* 0.4  PROT 5.4* 5.9*  ALBUMIN 2.6* 2.7*   No results for input(s): LIPASE, AMYLASE in the last 168 hours. No results for input(s): AMMONIA in the last 168 hours. Coagulation Profile: No results for input(s): INR, PROTIME in the last 168 hours. Cardiac Enzymes: No results for input(s): CKTOTAL, CKMB, CKMBINDEX, TROPONINI in the last 168 hours. BNP (last 3 results) No results for input(s): PROBNP in the last 8760 hours. HbA1C: No results for input(s): HGBA1C in the last 72 hours. CBG: Recent Labs  Lab 01/11/19 0637 01/11/19 1206 01/11/19 1719 01/11/19 2115 01/12/19 0756  GLUCAP 183* 140* 77 100* 123*   Lipid Profile: No results for input(s): CHOL, HDL, LDLCALC, TRIG, CHOLHDL, LDLDIRECT in the last 72 hours. Thyroid Function Tests: Recent Labs    01/11/19 0212  TSH 2.280   Anemia Panel: Recent Labs    01/11/19 0213  VITAMINB12 1,201*   Sepsis Labs: Recent Labs  Lab 01/10/19 1915  LATICACIDVEN 1.2    Recent Results (from the past 240 hour(s))  Urine culture     Status: Abnormal (Preliminary result)   Collection Time: 01/10/19  6:49 PM  Result Value Ref Range Status   Specimen Description URINE, RANDOM  Final   Special Requests NONE  Final   Culture (A)  Final    >=100,000 COLONIES/mL ESCHERICHIA COLI SUSCEPTIBILITIES TO FOLLOW Performed at [facility], [place] [address] [place], [place] [address]    Report Status PENDING  Incomplete  Blood Culture (routine x 2)     Status: None (Preliminary result)   Collection Time: 01/10/19  7:15 PM   Result Value Ref Range Status   Specimen Description BLOOD RIGHT ANTECUBITAL  Final   Special Requests   Final    BOTTLES DRAWN AEROBIC AND ANAEROBIC Blood Culture results may not be optimal due to an inadequate volume of blood received in culture bottles   Culture   Final    NO GROWTH < 12 HOURS Performed at [facility], [place] [address] [place], [place] [address]    Report Status PENDING  Incomplete  Blood Culture (routine x 2)     Status: None (Preliminary result)   Collection Time: 01/10/19  7:20 PM  Result Value Ref Range Status   Specimen Description BLOOD RIGHT HAND  Final   Special Requests  Final    BOTTLES DRAWN AEROBIC ONLY Blood Culture results may not be optimal due to an inadequate volume of blood received in culture bottles   Culture   Final    NO GROWTH < 12 HOURS Performed at [facility] [address] [place], [place] [address]    Report Status PENDING  Incomplete  MRSA PCR Screening     Status: Abnormal   Collection Time: 01/11/19  4:56 PM  Result Value Ref Range Status   MRSA by PCR POSITIVE (A) NEGATIVE Final    Comment:        The GeneXpert MRSA Assay (FDA approved for NASAL specimens only), is one component of a comprehensive MRSA colonization surveillance program. It is not intended to diagnose MRSA infection nor to guide or monitor treatment for MRSA infections. RESULT CALLED TO, READ BACK BY AND VERIFIED WITHSherlyn Lick RN 01/11/19 1912 JDW Performed at [facility] [address] [place], [place] [address]          Radiology Studies: Dg Chest [place] 1 View  Result Date: 01/10/2019 CLINICAL DATA:  85 year old nursing home patient presenting with acute mental status changes as she is not responding to questions as is her baseline. Hypotension upon EMS arrival. EXAM: PORTABLE CHEST 1 VIEW COMPARISON:  None. FINDINGS: Markedly suboptimal inspiration. Cardiac silhouette normal in size for technique. Thoracic aorta  atherosclerotic. Asymmetric patchy opacities at the LEFT lung base. Lungs otherwise clear. Pulmonary vascularity normal. IMPRESSION: Markedly suboptimal inspiration. Atelectasis versus bronchopneumonia at the LEFT lung base. Electronically Signed   By: Evangeline Dakin M.D.   On: 01/10/2019 18:24        Scheduled Meds: . Chlorhexidine Gluconate Cloth  6 each Topical Q0600  . dextromethorphan-guaiFENesin  1 tablet Oral BID  . heparin injection (subcutaneous)  5,000 Units Subcutaneous Q8H  . insulin aspart  0-9 Units Subcutaneous TID WC  . mupirocin ointment  1 application Nasal BID  . oseltamivir  30 mg Oral Daily   Continuous Infusions: . sodium chloride 100 mL/hr at 01/11/19 1723     LOS: 2 days        Aline August, MD [facility] 01/12/2019, 11:50 AM

## 2019-01-12 NOTE — Progress Notes (Signed)
Patient has been constantly pulling at lines/oxygen tubing. Spoke with Dr. Starla Link who discontinued telemetry orders and stated it was ok for patient to be without IV access. Patient continues to be in mitt restraints to prevent patient from removing oxygen cannula. Tele sitter remains in place. Will continue to monitor patient.   Hiram Comber, RN 01/12/2019 6:14 PM

## 2019-01-12 NOTE — Progress Notes (Signed)
Crcl 33 ml/min. Adjusting Tamiflu from 30mg  qday to 30mg  BID to finish on 3/1.   Onnie Boer, PharmD, BCIDP, AAHIVP, CPP Infectious Disease Pharmacist 01/12/2019 3:20 PM

## 2019-01-12 NOTE — Evaluation (Signed)
Physical Therapy Evaluation Patient Details Name: Christine Summers MRN: [id number] DOB: 1924-12-24 Today's Date: 01/12/2019   History of Present Illness  Christine Summers is a 85 y.o. female with medical history significant of type 2 diabetes, CVA presenting from her SNF for evaluation of altered mental status  Clinical Impression  Pt admitted with/for AMS and found to have the flu.  Pt presently at a mod to max level for mobility including standing and basic transfers..  Pt currently limited functionally due to the problems listed. ( See problems list.)   Pt will benefit from PT to maximize function and safety in order to get ready for next venue listed below.     Follow Up Recommendations SNF    Equipment Recommendations  None recommended by PT    Recommendations for Other Services       Precautions / Restrictions Precautions Precautions: Fall Restrictions Weight Bearing Restrictions: No      Mobility  Bed Mobility Overal bed mobility: Needs Assistance Bed Mobility: Supine to Sit;Sit to Supine     Supine to sit: Max assist;Mod assist Sit to supine: Max assist   General bed mobility comments: max A with LEs to EOB and back onot bed, max A with elevating trunk  Transfers Overall transfer level: Needs assistance   Transfers: Sit to/from Stand Sit to Stand: Max assist         General transfer comment: cues for hand placement.  significant assist for coming forward and for boost.  Ambulation/Gait                Stairs            Wheelchair Mobility    Modified Rankin (Stroke Patients Only)       Balance Overall balance assessment: Needs assistance Sitting-balance support: Feet supported;No upper extremity supported Sitting balance-Leahy Scale: Fair       Standing balance-Leahy Scale: Poor Standing balance comment: requires significant external support.                             Pertinent Vitals/Pain Pain Assessment:  No/denies pain    Home Living Family/patient expects to be discharged to:: Skilled nursing facility                 Additional Comments: Pt is a poor historian and confused; unable to answer PLOF questions. Pt states that she lives at home with her husband, 2 children and that she is 19 y/o    Prior Function Level of Independence: Needs assistance      ADL's / Homemaking Assistance Needed: uncertain of PLOF, suspect needing assist at SNF with all ADLs        Hand Dominance   Dominant Hand: Right    Extremity/Trunk Assessment   Upper Extremity Assessment Upper Extremity Assessment: Generalized weakness    Lower Extremity Assessment Lower Extremity Assessment: Generalized weakness       Communication   Communication: No difficulties  Cognition Arousal/Alertness: Awake/alert Behavior During Therapy: WFL for tasks assessed/performed Overall Cognitive Status: No family/caregiver present to determine baseline cognitive functioning Area of Impairment: Orientation;Memory;Following commands;Awareness                 Orientation Level: Disoriented to;Place;Time;Situation   Memory: Decreased short-term memory Following Commands: Follows one step commands inconsistently       General Comments:  Pt states that she lives at home with her husband, 2 children and that she  is 54 y/o. Pt A & O x1 (self only)      General Comments General comments (skin integrity, edema, etc.): sats on RA 91% at rest and 93% sitting up.  O2 reapplied.    Exercises Other Exercises Other Exercises: warm up ROM exercise.   Assessment/Plan    PT Assessment Patient needs continued PT services  PT Problem List Decreased strength;Decreased activity tolerance;Decreased balance;Decreased mobility;Decreased knowledge of use of DME;Cardiopulmonary status limiting activity       PT Treatment Interventions DME instruction;Gait training;Functional mobility training;Therapeutic  activities;Balance training;Patient/family education;Therapeutic exercise    PT Goals (Current goals can be found in the Care Plan section)  Acute Rehab PT Goals Patient Stated Goal: none stated PT Goal Formulation: With patient Time For Goal Achievement: 01/26/19 Potential to Achieve Goals: Fair    Frequency Min 2X/week   Barriers to discharge        Co-evaluation               AM-PAC PT "6 Clicks" Mobility  Outcome Measure Help needed turning from your back to your side while in a flat bed without using bedrails?: Total Help needed moving from lying on your back to sitting on the side of a flat bed without using bedrails?: Total Help needed moving to and from a bed to a chair (including a wheelchair)?: Total Help needed standing up from a chair using your arms (e.g., wheelchair or bedside chair)?: A Lot Help needed to walk in hospital room?: Total Help needed climbing 3-5 steps with a railing? : Total 6 Click Score: 7    End of Session   Activity Tolerance: Patient tolerated treatment well Patient left: in bed;with call bell/phone within reach;with bed alarm set Nurse Communication: Mobility status PT Visit Diagnosis: Muscle weakness (generalized) (M62.81)    Time: 3354-5625 PT Time Calculation (min) (ACUTE ONLY): 28 min   Charges:   PT Evaluation $PT Eval Moderate Complexity: 1 Mod PT Treatments $Therapeutic Activity: 8-22 mins        01/12/2019  Donnella Sham, PT Acute Rehabilitation Services [phone]  (pager) [phone]  (office)  Tessie Fass Mame Twombly 01/12/2019, 12:26 PM

## 2019-01-12 NOTE — Evaluation (Signed)
Occupational Therapy Evaluation Patient Details Name: Christine Summers MRN: [id number] DOB: 10/16/1925 Today's Date: 01/12/2019    History of Present Illness Christine Summers is a 85 y.o. female with medical history significant of type 2 diabetes, CVA presenting from her SNF for evaluation of altered mental status   Clinical Impression   Pt is a resident of a SNF with hx of cognitive impairments. Hand restraints on pt upon arrival and pt asking OT to remove them. Pt required max - mod A for bed mobility to sit EOB. Pt sat EOB for grooming and UB ADL tasks min guard A. Pt is a poor historian and pleasantly confused; unable to answer PLOF questions. No family present to answer PLOF questions or pt's baseline level of cognition. Pt states that she lives at home with her husband, 2 children and that she is 35 y/o. Suspect pt requires assist with most ADLs at SNF. Pt is able to feed herself with set up. Pt most likely will need + 2 assist with sit - stand and transfers unless she uses a lift at SNF. Hand restraints re applied at end of session. Pt would benefit from acute OT services to address impairments to maximize level of function and safety    Follow Up Recommendations  SNF;Supervision/Assistance - 24 hour    Equipment Recommendations   none   Recommendations for Other Services  PT consult     Precautions / Restrictions Precautions Precautions: Fall Restrictions Weight Bearing Restrictions: No      Mobility Bed Mobility Overal bed mobility: Needs Assistance Bed Mobility: Supine to Sit;Sit to Supine     Supine to sit: Max assist;Mod assist Sit to supine: Mod assist   General bed mobility comments: mod A with LEs to EOB and back onot bed, max A with elevating trunk  Transfers Overall transfer level: Needs assistance               General transfer comment: NT, will need +2 assist    Balance Overall balance assessment: Needs assistance Sitting-balance support: Feet  supported;No upper extremity supported Sitting balance-Leahy Scale: Fair                                     ADL either performed or assessed with clinical judgement   ADL Overall ADL's : Needs assistance/impaired Eating/Feeding: Set up;Sitting;Bed level   Grooming: Wash/dry hands;Wash/dry face;Sitting;Min guard   Upper Body Bathing: Sitting;Min guard   Lower Body Bathing: Total assistance   Upper Body Dressing : Sitting;Min guard   Lower Body Dressing: Total assistance     Toilet Transfer Details (indicate cue type and reason): unable to test, will need +2 assist, pt fearful and unwilling to attempt Toileting- Clothing Manipulation and Hygiene: Total assistance;Bed level         General ADL Comments: Pt sat EOB for UB selfcare and grooming tasks      Vision Patient Visual Report: No change from baseline(per pt report)       Perception     Praxis      Pertinent Vitals/Pain Pain Assessment: No/denies pain     Hand Dominance Right   Extremity/Trunk Assessment Upper Extremity Assessment Upper Extremity Assessment: Generalized weakness   Lower Extremity Assessment Lower Extremity Assessment: Defer to PT evaluation       Communication Communication Communication: No difficulties   Cognition Arousal/Alertness: Awake/alert Behavior During Therapy: WFL for tasks assessed/performed Overall  Cognitive Status: No family/caregiver present to determine baseline cognitive functioning Area of Impairment: Orientation;Memory;Following commands;Awareness                 Orientation Level: Disoriented to;Place;Time;Situation   Memory: Decreased short-term memory Following Commands: Follows one step commands inconsistently       General Comments:  Pt states that she lives at home with her husband, 2 children and that she is 8 y/o. Pt A & O x1 (self only)   General Comments       Exercises     Shoulder Instructions      Home Living  Family/patient expects to be discharged to:: Skilled nursing facility                                 Additional Comments: Pt is a poor historian and confused; unable to answer PLOF questions. Pt states that she lives at home with her husband, 2 children and that she is 77 y/o      Prior Functioning/Environment Level of Independence: Needs assistance    ADL's / Homemaking Assistance Needed: uncertain of PLOF, suspect needing assist at SNF with all ADLs            OT Problem List: Decreased strength;Decreased activity tolerance;Decreased cognition;Decreased knowledge of use of DME or AE;Impaired balance (sitting and/or standing);Decreased safety awareness;Obesity      OT Treatment/Interventions: Self-care/ADL training;Therapeutic activities;Patient/family education    OT Goals(Current goals can be found in the care plan section) Acute Rehab OT Goals Patient Stated Goal: none stated OT Goal Formulation: Patient unable to participate in goal setting Time For Goal Achievement: 01/26/19 Potential to Achieve Goals: Good ADL Goals Pt Will Perform Grooming: with supervision;with set-up;sitting Pt Will Perform Upper Body Bathing: with supervision;with set-up;sitting Pt Will Perform Upper Body Dressing: with supervision;with set-up;sitting Additional ADL Goal #1: Pt will complete bed mobility with min A to sit EOB in prep for selfcare  OT Frequency: Min 2X/week   Barriers to D/C:    pt is a LTC resident of a SNF       Co-evaluation              AM-PAC OT "6 Clicks" Daily Activity     Outcome Measure Help from another person eating meals?: None Help from another person taking care of personal grooming?: A Little Help from another person toileting, which includes using toliet, bedpan, or urinal?: Total Help from another person bathing (including washing, rinsing, drying)?: A Little Help from another person to put on and taking off regular upper body clothing?: A  Little Help from another person to put on and taking off regular lower body clothing?: Total 6 Click Score: 15   End of Session    Activity Tolerance: Patient tolerated treatment well Patient left: in bed;with call bell/phone within reach;with bed alarm set;with restraints reapplied  OT Visit Diagnosis: Muscle weakness (generalized) (M62.81);Other symptoms and signs involving cognitive function;Other abnormalities of gait and mobility (R26.89)                Time: 9811-9147 OT Time Calculation (min): 25 min Charges:  OT General Charges $OT Visit: 1 Visit OT Evaluation $OT Eval Moderate Complexity: 1 Mod OT Treatments $Self Care/Home Management : 8-22 mins    Emmit Alexanders [facility] 01/12/2019, 11:09 AM

## 2019-01-13 DIAGNOSIS — J111 Influenza due to unidentified influenza virus with other respiratory manifestations: Secondary | ICD-10-CM

## 2019-01-13 LAB — URINE CULTURE: Culture: >=100000 — AB

## 2019-01-13 LAB — GLUCOSE, CAPILLARY
GLUCOSE-CAPILLARY: 124 mg/dL — AB (ref 70–99)
Glucose-Capillary: 181 mg/dL — ABNORMAL HIGH (ref 70–99)
Glucose-Capillary: 150 mg/dL — ABNORMAL HIGH (ref 70–99)
Glucose-Capillary: 97 mg/dL (ref 70–99)

## 2019-01-13 NOTE — NC FL2 (Signed)
[place] MEDICAID FL2 LEVEL OF CARE SCREENING TOOL     IDENTIFICATION  Patient Name: Christine Summers Birthdate: 1925/03/22 Sex: female Admission Date (Current Location): 01/10/2019  [facility] and [place] Number:  Engineering geologist and Address:  The . [facility], [place] [address], [place], [place] [address]      Provider Number: [id number]  Attending Physician Name and Address:  Aline August, MD  Relative Name and Phone Number:  Hassan Rowan daughter, [phone]    Current Level of Care: Hospital Recommended Level of Care: [place] Prior Approval Number:    Date Approved/Denied:   PASRR Number: [id number]  Discharge Plan: SNF    Current Diagnoses: Patient Active Problem List   Diagnosis Date Noted  . Septic shock ([place]) 01/10/2019  . HCAP (healthcare-associated pneumonia) 01/10/2019  . UTI (urinary tract infection) 01/10/2019  . Acute respiratory failure with hypoxia and hypercapnia ([place]) 01/10/2019  . AMS (altered mental status) 01/10/2019  . AKI (acute kidney injury) ([place]) 01/10/2019  . Pressure ulcer of sacral region, stage 3 ([place]) 03/02/2017  . Protein-calorie malnutrition, moderate ([place]) 03/02/2017  . OAB (overactive bladder) 11/23/2016  . Chronic constipation 07/29/2016  . Mixed Alzheimer's and vascular dementia ([place]) 06/24/2016  . Microalbuminuria due to type 2 diabetes mellitus ([place]) 03/18/2016  . Insomnia 03/18/2016  . Major depression, chronic 03/18/2016  . Type 2 diabetes mellitus with vascular disease ([place]) 01/29/2016  . Hyperlipidemia LDL goal <100 01/29/2016  . Vitamin D deficiency 01/29/2016  . Obesity 01/29/2016  . Alzheimer's disease ([place]) 01/14/2016  . Anemia of chronic disease 01/14/2016  . Hypertensive renal disease 01/14/2016  . Type 2 diabetes mellitus with stage 3 chronic kidney disease ([place]) 01/14/2016  . Absence of bladder continence 01/14/2016  . CVA, old, cognitive deficits 01/14/2016  . Senile  purpura ([place]) 01/14/2016  . Postmenopausal atrophic vaginitis 01/14/2016  . Cough 01/03/2016  . Diabetes ([place]) 01/01/2016    Orientation RESPIRATION BLADDER Height & Weight     Self  O2(Nasal cannula 2L) Incontinent, External catheter Weight: 76.2 kg Height:  5' (152.4 cm)  BEHAVIORAL SYMPTOMS/MOOD NEUROLOGICAL BOWEL NUTRITION STATUS      Continent(Rectal pouch) Diet(Please see DC Summary)  AMBULATORY STATUS COMMUNICATION OF NEEDS Skin   Extensive Assist Verbally Normal                       Personal Care Assistance Level of Assistance  Bathing, Feeding, Dressing Bathing Assistance: Maximum assistance Feeding assistance: Limited assistance Dressing Assistance: Limited assistance     Functional Limitations Info  Sight, Hearing, Speech Sight Info: Adequate Hearing Info: Adequate Speech Info: Adequate    SPECIAL CARE FACTORS FREQUENCY                       Contractures Contractures Info: Not present    Additional Factors Info  Code Status, Allergies, Isolation Precautions, Insulin Sliding Scale Code Status Info: DNR Allergies Info: Codeine   Insulin Sliding Scale Info: 3x daily with meals Isolation Precautions Info: ESBL; MRSA     Current Medications (01/13/2019):  This is the current hospital active medication list Current Facility-Administered Medications  Medication Dose Route Frequency Provider Last Rate Last Dose  . Chlorhexidine Gluconate Cloth 2 % PADS 6 each  6 each Topical Q0600 Aline August, MD   6 each at 01/13/19 0554  . dextromethorphan-guaiFENesin (MUCINEX DM) 30-600 MG per 12 hr tablet 1 tablet  1 tablet Oral BID Shela Leff, MD  1 tablet at 01/13/19 0856  . heparin injection 5,000 Units  5,000 Units Subcutaneous Q8H Dessa Phi, DO   5,000 Units at 01/13/19 1216  . insulin aspart (novoLOG) injection 0-9 Units  0-9 Units Subcutaneous TID WC Dessa Phi, DO   1 Units at 01/13/19 0901  . mupirocin ointment (BACTROBAN) 2 % 1  application  1 application Nasal BID Aline August, MD   1 application at 24/46/95 0901  . oseltamivir (TAMIFLU) capsule 30 mg  30 mg Oral BID Aline August, MD   30 mg at 01/13/19 0722     Discharge Medications: Please see discharge summary for a list of discharge medications.  Relevant Imaging Results:  Relevant Lab Results:   Additional Information SSN: [id number]    Tamiflu complete on 01/15/19  Benard Halsted, LCSW

## 2019-01-13 NOTE — Progress Notes (Signed)
Patient ID: Christine Summers, female   DOB: [birth year], 85 y.o.   MRN: [id number]  PROGRESS NOTE    Christine Summers  JGG:[id number] DOB: [birth year] DOA: 01/10/2019 PCP: Patient, No Pcp Per   Brief Narrative:  85 year old female with history of diabetes mellitus type 2, CVA presented from her SNF for evaluation of altered mental status.  Patient was initially hypotensive which improved with IV fluids.  She is admitted for pneumonia with influenza A and started on antibiotics, IV fluids.  Patient was initially very minimally responsive but later on became more responsive.  Assessment & Plan:   Principal Problem:   Septic shock ([place]) Active Problems:   HCAP (healthcare-associated pneumonia)   UTI (urinary tract infection)   Acute respiratory failure with hypoxia and hypercapnia (HCC)   AMS (altered mental status)   AKI (acute kidney injury) ([place])  Sepsis probably from influenza A -Treated with IV fluids.  Blood pressure has improved.  Blood cultures have been negative.  Urine is growing E. coli but is probably a colonizer -Patient was started on broad-spectrum antibiotics as well.  Broad-spectrum antibiotics were discontinued on 01/12/2019.  Patient has not spiked temperature since then.  Continue Tamiflu to complete 5-day course of therapy along with oral prednisone. -Doubt that patient has bacterial HCAP  Acute metabolic encephalopathy in a patient with history of dementia  -Patient was somnolent on admission.  Now probably back to her baseline. -Monitor mental status.    Acute hypoxic respiratory failure -Probably secondary to influenza.  Currently on 2 L oxygen via nasal cannula.  Wean off as able.  Thrombocytopenia -Questionable cause.  Monitor.  No signs of bleeding  Acute kidney injury  -Baseline creatinine 0.8-1.1.  Creatinine has improved.  Off IV fluids.  Refused labs this morning.  Diabetes mellitus type 2 -Sliding scale insulin  Dementia -Fall precaution.  Delirium  precautions   DVT prophylaxis: Subcutaneous heparin Code Status: DNR Family Communication: None at bedside Disposition Plan: SNF after completion of Tamiflu.  Consultants: None  Procedures: None  Antimicrobials: Vancomycin and cefepime 01/10/2019-01/11/2019 Tamiflu from 01/10/2019 onwards   Subjective: Patient seen and examined at bedside.  She is awake but is confused.  No overnight fever, vomiting. Objective: Vitals:   01/12/19 1423 01/12/19 1553 01/12/19 2025 01/13/19 0644  BP: (!) 136/109 (!) 137/91 (!) 151/63 (!) 145/84  Pulse: (!) 107 (!) 104 (!) 108 96  Resp: 16  20 18   Temp: 97.8 F (36.6 C)  98.1 F (36.7 C) 97.7 F (36.5 C)  TempSrc: Oral  Oral Oral  SpO2: 97% 98% 99% 100%  Weight:      Height:        Intake/Output Summary (Last 24 hours) at 01/13/2019 0932 Last data filed at 01/13/2019 0647 Gross per 24 hour  Intake 2002.74 ml  Output 1000 ml  Net 1002.74 ml   Filed Weights   01/10/19 1935  Weight: 76.2 kg    Examination:  General exam: Appears calm and comfortable.  Elderly female.  Confused.  No acute distress. Respiratory system: Bilateral decreased breath sounds at bases, some scattered crackles.  No wheezing Cardiovascular system: S1 & S2 heard, tachycardic Gastrointestinal system: Abdomen is nondistended, soft and nontender. Normal bowel sounds heard. Extremities: No cyanosis, edema    Data Reviewed: I have personally reviewed following labs and imaging studies  CBC: Recent Labs  Lab 01/10/19 1915 01/10/19 1945 01/12/19 0505  WBC 3.8*  --  4.5  NEUTROABS 2.6  --   --  HGB 12.7 11.9* 11.5*  HCT 41.2 35.0* 37.3  MCV 90.2  --  89.7  PLT 133*  --  203*   Basic Metabolic Panel: Recent Labs  Lab 01/10/19 1915 01/10/19 1945 01/10/19 2114 01/11/19 0213 01/12/19 0505  NA 131* 135  --  136 136  K 5.5* 3.9 3.8 3.9 4.0  CL 103  --   --  105 109  CO2 21*  --   --  17* 21*  GLUCOSE 210*  --   --  226* 100*  BUN 26*  --   --  21 13    CREATININE 1.37*  --   --  1.37* 1.02*  CALCIUM 8.1*  --   --  8.0* 7.5*   GFR: Estimated Creatinine Clearance: 32.8 mL/min (A) (by C-G formula based on SCr of 1.02 mg/dL (H)). Liver Function Tests: Recent Labs  Lab 01/10/19 1915 01/11/19 0213  AST 46* 40  ALT 13 18  ALKPHOS 64 63  BILITOT 1.3* 0.4  PROT 5.4* 5.9*  ALBUMIN 2.6* 2.7*   No results for input(s): LIPASE, AMYLASE in the last 168 hours. No results for input(s): AMMONIA in the last 168 hours. Coagulation Profile: No results for input(s): INR, PROTIME in the last 168 hours. Cardiac Enzymes: No results for input(s): CKTOTAL, CKMB, CKMBINDEX, TROPONINI in the last 168 hours. BNP (last 3 results) No results for input(s): PROBNP in the last 8760 hours. HbA1C: No results for input(s): HGBA1C in the last 72 hours. CBG: Recent Labs  Lab 01/12/19 1250 01/12/19 1706 01/12/19 1740 01/12/19 2118 01/13/19 0855  GLUCAP 114* 151* 170* 134* 124*   Lipid Profile: No results for input(s): CHOL, HDL, LDLCALC, TRIG, CHOLHDL, LDLDIRECT in the last 72 hours. Thyroid Function Tests: Recent Labs    01/11/19 0212  TSH 2.280   Anemia Panel: Recent Labs    01/11/19 0213  VITAMINB12 1,201*   Sepsis Labs: Recent Labs  Lab 01/10/19 1915  LATICACIDVEN 1.2    Recent Results (from the past 240 hour(s))  Urine culture     Status: Abnormal   Collection Time: 01/10/19  6:49 PM  Result Value Ref Range Status   Specimen Description URINE, RANDOM  Final   Special Requests   Final    NONE Performed at [facility], [place] [address], [place], [place] [address]    Culture (A)  Final    >=100,000 COLONIES/mL ESCHERICHIA COLI Confirmed Extended Spectrum Beta-Lactamase Producer (ESBL).  In bloodstream infections from ESBL organisms, carbapenems are preferred over piperacillin/tazobactam. They are shown to have a lower risk of mortality.    Report Status 01/13/2019 FINAL  Final   Organism ID, Bacteria ESCHERICHIA COLI (A)   Final      Susceptibility   Escherichia coli - MIC*    AMPICILLIN >=32 RESISTANT Resistant     CEFAZOLIN >=64 RESISTANT Resistant     CEFTRIAXONE >=64 RESISTANT Resistant     CIPROFLOXACIN >=4 RESISTANT Resistant     GENTAMICIN <=1 SENSITIVE Sensitive     IMIPENEM <=0.25 SENSITIVE Sensitive     NITROFURANTOIN 32 SENSITIVE Sensitive     TRIMETH/SULFA >=320 RESISTANT Resistant     AMPICILLIN/SULBACTAM >=32 RESISTANT Resistant     PIP/TAZO 8 SENSITIVE Sensitive     Extended ESBL POSITIVE Resistant     * >=100,000 COLONIES/mL ESCHERICHIA COLI  Blood Culture (routine x 2)     Status: None (Preliminary result)   Collection Time: 01/10/19  7:15 PM  Result Value Ref Range Status  Specimen Description BLOOD RIGHT ANTECUBITAL  Final   Special Requests   Final    BOTTLES DRAWN AEROBIC AND ANAEROBIC Blood Culture results may not be optimal due to an inadequate volume of blood received in culture bottles   Culture NO GROWTH 3 DAYS  Final   Report Status PENDING  Incomplete  Blood Culture (routine x 2)     Status: None (Preliminary result)   Collection Time: 01/10/19  7:20 PM  Result Value Ref Range Status   Specimen Description BLOOD RIGHT HAND  Final   Special Requests   Final    BOTTLES DRAWN AEROBIC ONLY Blood Culture results may not be optimal due to an inadequate volume of blood received in culture bottles   Culture NO GROWTH 3 DAYS  Final   Report Status PENDING  Incomplete  MRSA PCR Screening     Status: Abnormal   Collection Time: 01/11/19  4:56 PM  Result Value Ref Range Status   MRSA by PCR POSITIVE (A) NEGATIVE Final    Comment:        The GeneXpert MRSA Assay (FDA approved for NASAL specimens only), is one component of a comprehensive MRSA colonization surveillance program. It is not intended to diagnose MRSA infection nor to guide or monitor treatment for MRSA infections. RESULT CALLED TO, READ BACK BY AND VERIFIED WITHSherlyn Lick RN 01/11/19 1912 JDW Performed at [facility] [address] [place], [place] [address]          Radiology Studies: No results found.      Scheduled Meds: . Chlorhexidine Gluconate Cloth  6 each Topical Q0600  . dextromethorphan-guaiFENesin  1 tablet Oral BID  . heparin injection (subcutaneous)  5,000 Units Subcutaneous Q8H  . insulin aspart  0-9 Units Subcutaneous TID WC  . mupirocin ointment  1 application Nasal BID  . oseltamivir  30 mg Oral BID   Continuous Infusions: . sodium chloride Stopped (01/12/19 1258)     LOS: 3 days        Aline August, MD [facility] 01/13/2019, 9:32 AM

## 2019-01-13 NOTE — Progress Notes (Signed)
Patient refused lab this am

## 2019-01-13 NOTE — Progress Notes (Signed)
CSW left voicemail for patient's daughter, Hassan Rowan, regarding return to [place] on Monday. [facility] requires 5 days of tamiflu to be completed before patient can return.   Percell Locus Tayten Bergdoll LCSW [phone]

## 2019-01-13 NOTE — Clinical Social Work Note (Signed)
Clinical Social Work Assessment  Patient Details  Name: Christine Summers MRN: [id number] Date of Birth: [birth year]  Date of referral:  01/13/19               Reason for consult:  Facility Placement                Permission sought to share information with:  Facility Sport and exercise psychologist, Family Supports Permission granted to share information::  No  Name::     Brenda/Sue  Agency::  [facility]  Relationship::  Daughters  Contact Information:  Collie Siad [phone]  Housing/Transportation Living arrangements for the past 2 months:  [place] of Information:  Adult Children, Facility Patient Interpreter Needed:  None Criminal Activity/Legal Involvement Pertinent to Current Situation/Hospitalization:  No - Comment as needed Significant Relationships:  Adult Children Lives with:  Facility Resident Do you feel safe going back to the place where you live?  Yes Need for family participation in patient care:  Yes (Comment)  Care giving concerns:  CSW received consult for possible SNF placement at time of discharge. CSW spoke with patient's daughter, Collie Siad. She reports that patient has resided at [facility] for long term care and will return at discharge. She and her sister, brenda, act as patient's POA. CSW to continue to follow and assist with discharge planning needs.   Social Worker assessment / plan:  CSW spoke with patient's daughter concerning return to [facility].   Employment status:  Retired Nurse, adult PT Recommendations:  [place] / Referral to community resources:  [place]  Patient/Family's Response to care:  Patient's daughter reports agreement with discharge plan and requests [facility] for transport. She is aware that patient will need to complete tamiflu course before returning to [place].  Patient/Family's Understanding of and Emotional Response to Diagnosis, Current Treatment, and Prognosis:   Patient/family is realistic regarding therapy needs and expressed being hopeful for SNF placement. Patient's daughter expressed understanding of CSW role and discharge process as well as medical condition. No questions/concerns about plan or treatment.    Emotional Assessment Appearance:  Appears stated age Attitude/Demeanor/Rapport:  Unable to Assess Affect (typically observed):  Unable to Assess Orientation:  Oriented to Self Alcohol / Substance use:  Not Applicable Psych involvement (Current and /or in the community):  No (Comment)  Discharge Needs  Concerns to be addressed:  Care Coordination Readmission within the last 30 days:  No Current discharge risk:  None Barriers to Discharge:  Continued Medical Work up   [employer], LCSW 01/13/2019, 1:02 PM

## 2019-01-14 LAB — CBC
HCT: 37.5 % (ref 36.0–46.0)
Hemoglobin: 12.1 g/dL (ref 12.0–15.0)
MCH: 27.7 pg (ref 26.0–34.0)
MCHC: 32.3 g/dL (ref 30.0–36.0)
MCV: 85.8 fL (ref 80.0–100.0)
Platelets: 132 10*3/uL — ABNORMAL LOW (ref 150–400)
RBC: 4.37 MIL/uL (ref 3.87–5.11)
RDW: 13.4 % (ref 11.5–15.5)
WBC: 4.2 10*3/uL (ref 4.0–10.5)
nRBC: 0 % (ref 0.0–0.2)

## 2019-01-14 LAB — GLUCOSE, CAPILLARY
GLUCOSE-CAPILLARY: 163 mg/dL — AB (ref 70–99)
Glucose-Capillary: 108 mg/dL — ABNORMAL HIGH (ref 70–99)
Glucose-Capillary: 152 mg/dL — ABNORMAL HIGH (ref 70–99)
Glucose-Capillary: 253 mg/dL — ABNORMAL HIGH (ref 70–99)

## 2019-01-14 LAB — BASIC METABOLIC PANEL
Anion gap: 7 (ref 5–15)
BUN: 7 mg/dL — ABNORMAL LOW (ref 8–23)
CO2: 24 mmol/L (ref 22–32)
Calcium: 8.3 mg/dL — ABNORMAL LOW (ref 8.9–10.3)
Chloride: 105 mmol/L (ref 98–111)
Creatinine, Ser: 0.76 mg/dL (ref 0.44–1.00)
GFR calc non Af Amer: >60 mL/min (ref >60)
Glucose, Bld: 105 mg/dL — ABNORMAL HIGH (ref 70–99)
Potassium: 3.8 mmol/L (ref 3.5–5.1)
Sodium: 136 mmol/L (ref 135–145)

## 2019-01-14 LAB — MAGNESIUM: Magnesium: 1.5 mg/dL — ABNORMAL LOW (ref 1.7–2.4)

## 2019-01-14 MED ORDER — LAMOTRIGINE 25 MG PO TABS
50.0000 mg | ORAL_TABLET | Freq: Two times a day (BID) | ORAL | Status: DC
  Administered 2019-01-14 – 2019-01-16 (×5): 50 mg via ORAL
  Filled 2019-01-14 (×5): qty 2

## 2019-01-14 MED ORDER — VITAMIN B-12 100 MCG PO TABS
500.0000 ug | ORAL_TABLET | Freq: Every day | ORAL | Status: DC
  Administered 2019-01-14 – 2019-01-16 (×3): 500 ug via ORAL
  Filled 2019-01-14 (×3): qty 5

## 2019-01-14 MED ORDER — SENNA 8.6 MG PO TABS
1.0000 | ORAL_TABLET | Freq: Every day | ORAL | Status: DC
  Administered 2019-01-14 – 2019-01-15 (×2): 8.6 mg via ORAL
  Filled 2019-01-14 (×2): qty 1

## 2019-01-14 MED ORDER — DONEPEZIL HCL 10 MG PO TABS
10.0000 mg | ORAL_TABLET | Freq: Every day | ORAL | Status: DC
  Administered 2019-01-14 – 2019-01-15 (×2): 10 mg via ORAL
  Filled 2019-01-14 (×2): qty 1

## 2019-01-14 MED ORDER — IPRATROPIUM-ALBUTEROL 0.5-2.5 (3) MG/3ML IN SOLN
3.0000 mL | RESPIRATORY_TRACT | Status: DC | PRN
  Administered 2019-01-14 – 2019-01-16 (×4): 3 mL via RESPIRATORY_TRACT
  Filled 2019-01-14 (×4): qty 3

## 2019-01-14 MED ORDER — OXYBUTYNIN CHLORIDE 5 MG PO TABS
5.0000 mg | ORAL_TABLET | Freq: Two times a day (BID) | ORAL | Status: DC
  Administered 2019-01-14 – 2019-01-16 (×5): 5 mg via ORAL
  Filled 2019-01-14 (×5): qty 1

## 2019-01-14 MED ORDER — POLYETHYLENE GLYCOL 3350 17 G PO PACK
17.0000 g | PACK | Freq: Every day | ORAL | Status: DC
  Filled 2019-01-14: qty 1

## 2019-01-14 MED ORDER — PREDNISONE 20 MG PO TABS
40.0000 mg | ORAL_TABLET | Freq: Every day | ORAL | Status: DC
  Administered 2019-01-14 – 2019-01-16 (×3): 40 mg via ORAL
  Filled 2019-01-14 (×3): qty 2

## 2019-01-14 MED ORDER — ORAL CARE MOUTH RINSE
15.0000 mL | Freq: Two times a day (BID) | OROMUCOSAL | Status: DC
  Administered 2019-01-15 – 2019-01-16 (×3): 15 mL via OROMUCOSAL

## 2019-01-14 MED ORDER — DOCUSATE SODIUM 100 MG PO CAPS
100.0000 mg | ORAL_CAPSULE | Freq: Two times a day (BID) | ORAL | Status: DC
  Administered 2019-01-14 – 2019-01-15 (×2): 100 mg via ORAL
  Filled 2019-01-14 (×3): qty 1

## 2019-01-14 NOTE — Progress Notes (Addendum)
Patient ID: Christine Summers, female   DOB: 20-Jan-1925, 85 y.o.   MRN: [id number]  PROGRESS NOTE    Christine Summers  NLG:[id number] DOB: 09-10-1925 DOA: 01/10/2019 PCP: Patient, No Pcp Per   Brief Narrative:  85 year old female with history of diabetes mellitus type 2, CVA presented from her SNF for evaluation of altered mental status.  Patient was initially hypotensive which improved with IV fluids.  She is admitted for pneumonia with influenza A and started on antibiotics, IV fluids.  Patient was initially very minimally responsive but later on became more responsive.  Assessment & Plan:   Principal Problem:   Septic shock ([place]) Active Problems:   HCAP (healthcare-associated pneumonia)   UTI (urinary tract infection)   Acute respiratory failure with hypoxia and hypercapnia (HCC)   AMS (altered mental status)   AKI (acute kidney injury) ([place])  Sepsis probably from influenza A -Treated with IV fluids.  Blood pressure has improved.  Blood cultures have been negative.  Urine is growing E. coli but is probably a colonizer -Patient was started on broad-spectrum antibiotics as well.  Broad-spectrum antibiotics were discontinued on 01/12/2019.  Patient has not spiked temperature since then.  Continue Tamiflu to complete 5-day course of therapy.  Prednisone 40 mg daily for 5 days. -Doubt that patient has bacterial HCAP  Acute metabolic encephalopathy in a patient with history of dementia  -Patient was somnolent on admission.  Now probably back to her baseline. -Monitor mental status.    Acute hypoxic respiratory failure -Probably secondary to influenza.  Currently on 2 L oxygen via nasal cannula.  Wean off as able.  Hypomagnesemia--we will treat with oral magnesium oxide  Thrombocytopenia -Questionable cause.  Monitor.  No signs of bleeding  Acute kidney injury  -Baseline creatinine 0.8-1.1.  Resolved.  Off IV fluids.    Diabetes mellitus type 2 -Sliding scale insulin  Dementia -Fall  precaution.  Delirium precautions   DVT prophylaxis: Subcutaneous heparin Code Status: DNR Family Communication: None at bedside Disposition Plan: SNF after completion of Tamiflu.  Consultants: None  Procedures: None  Antimicrobials: Vancomycin and cefepime 01/10/2019-01/11/2019 Tamiflu from 01/10/2019 onwards   Subjective: Patient seen and examined at bedside.  She is sleepy, wakes up slightly, does not answer questions.  No overnight fever, vomiting. Objective: Vitals:   01/13/19 1657 01/13/19 2219 01/14/19 0637 01/14/19 1020  BP: (!) 150/83 (!) 139/99 133/81   Pulse: 94 90 89   Resp: (!) 24 19    Temp: 98.1 F (36.7 C) (!) 97.4 F (36.3 C) 97.8 F (36.6 C)   TempSrc: Oral Oral Oral   SpO2: 99% 100% 97% 98%  Weight:      Height:        Intake/Output Summary (Last 24 hours) at 01/14/2019 1027 Last data filed at 01/14/2019 0843 Gross per 24 hour  Intake 836 ml  Output 750 ml  Net 86 ml   Filed Weights   01/10/19 1935  Weight: 76.2 kg    Examination:  General exam: Appears calm and comfortable.  Elderly female.  Sleepy, wakes up slightly, confused.  No  distress. Respiratory system: Bilateral decreased breath sounds at bases, some scattered crackles.  Scattered wheezing Cardiovascular system: S1 & S2 heard, rate controlled Gastrointestinal system: Abdomen is nondistended, soft and nontender. Normal bowel sounds heard. Extremities: No cyanosis, edema    Data Reviewed: I have personally reviewed following labs and imaging studies  CBC: Recent Labs  Lab 01/10/19 1915 01/10/19 1945 01/12/19 0505 01/14/19 0814  WBC 3.8*  --  4.5 4.2  NEUTROABS 2.6  --   --   --   HGB 12.7 11.9* 11.5* 12.1  HCT 41.2 35.0* 37.3 37.5  MCV 90.2  --  89.7 85.8  PLT 133*  --  130* 696*   Basic Metabolic Panel: Recent Labs  Lab 01/10/19 1915 01/10/19 1945 01/10/19 2114 01/11/19 0213 01/12/19 0505 01/14/19 0433  NA 131* 135  --  136 136 136  K 5.5* 3.9 3.8 3.9 4.0 3.8  CL  103  --   --  105 109 105  CO2 21*  --   --  17* 21* 24  GLUCOSE 210*  --   --  226* 100* 105*  BUN 26*  --   --  21 13 7*  CREATININE 1.37*  --   --  1.37* 1.02* 0.76  CALCIUM 8.1*  --   --  8.0* 7.5* 8.3*  MG  --   --   --   --   --  1.5*   GFR: Estimated Creatinine Clearance: 41.8 mL/min (by C-G formula based on SCr of 0.76 mg/dL). Liver Function Tests: Recent Labs  Lab 01/10/19 1915 01/11/19 0213  AST 46* 40  ALT 13 18  ALKPHOS 64 63  BILITOT 1.3* 0.4  PROT 5.4* 5.9*  ALBUMIN 2.6* 2.7*   No results for input(s): LIPASE, AMYLASE in the last 168 hours. No results for input(s): AMMONIA in the last 168 hours. Coagulation Profile: No results for input(s): INR, PROTIME in the last 168 hours. Cardiac Enzymes: No results for input(s): CKTOTAL, CKMB, CKMBINDEX, TROPONINI in the last 168 hours. BNP (last 3 results) No results for input(s): PROBNP in the last 8760 hours. HbA1C: No results for input(s): HGBA1C in the last 72 hours. CBG: Recent Labs  Lab 01/13/19 0855 01/13/19 1204 01/13/19 1641 01/13/19 2214 01/14/19 0747  GLUCAP 124* 181* 150* 97 108*   Lipid Profile: No results for input(s): CHOL, HDL, LDLCALC, TRIG, CHOLHDL, LDLDIRECT in the last 72 hours. Thyroid Function Tests: No results for input(s): TSH, T4TOTAL, FREET4, T3FREE, THYROIDAB in the last 72 hours. Anemia Panel: No results for input(s): VITAMINB12, FOLATE, FERRITIN, TIBC, IRON, RETICCTPCT in the last 72 hours. Sepsis Labs: Recent Labs  Lab 01/10/19 1915  LATICACIDVEN 1.2    Recent Results (from the past 240 hour(s))  Urine culture     Status: Abnormal   Collection Time: 01/10/19  6:49 PM  Result Value Ref Range Status   Specimen Description URINE, RANDOM  Final   Special Requests   Final    NONE Performed at [facility], [address], [place], [place] [address]    Culture (A)  Final    >=100,000 COLONIES/mL ESCHERICHIA COLI Confirmed Extended Spectrum Beta-Lactamase Producer  (ESBL).  In bloodstream infections from ESBL organisms, carbapenems are preferred over piperacillin/tazobactam. They are shown to have a lower risk of mortality.    Report Status 01/13/2019 FINAL  Final   Organism ID, Bacteria ESCHERICHIA COLI (A)  Final      Susceptibility   Escherichia coli - MIC*    AMPICILLIN >=32 RESISTANT Resistant     CEFAZOLIN >=64 RESISTANT Resistant     CEFTRIAXONE >=64 RESISTANT Resistant     CIPROFLOXACIN >=4 RESISTANT Resistant     GENTAMICIN <=1 SENSITIVE Sensitive     IMIPENEM <=0.25 SENSITIVE Sensitive     NITROFURANTOIN 32 SENSITIVE Sensitive     TRIMETH/SULFA >=320 RESISTANT Resistant     AMPICILLIN/SULBACTAM >=32 RESISTANT  Resistant     PIP/TAZO 8 SENSITIVE Sensitive     Extended ESBL POSITIVE Resistant     * >=100,000 COLONIES/mL ESCHERICHIA COLI  Blood Culture (routine x 2)     Status: None (Preliminary result)   Collection Time: 01/10/19  7:15 PM  Result Value Ref Range Status   Specimen Description BLOOD RIGHT ANTECUBITAL  Final   Special Requests   Final    BOTTLES DRAWN AEROBIC AND ANAEROBIC Blood Culture results may not be optimal due to an inadequate volume of blood received in culture bottles   Culture NO GROWTH 3 DAYS  Final   Report Status PENDING  Incomplete  Blood Culture (routine x 2)     Status: None (Preliminary result)   Collection Time: 01/10/19  7:20 PM  Result Value Ref Range Status   Specimen Description BLOOD RIGHT HAND  Final   Special Requests   Final    BOTTLES DRAWN AEROBIC ONLY Blood Culture results may not be optimal due to an inadequate volume of blood received in culture bottles   Culture NO GROWTH 3 DAYS  Final   Report Status PENDING  Incomplete  MRSA PCR Screening     Status: Abnormal   Collection Time: 01/11/19  4:56 PM  Result Value Ref Range Status   MRSA by PCR POSITIVE (A) NEGATIVE Final    Comment:        The GeneXpert MRSA Assay (FDA approved for NASAL specimens only), is one component of  a comprehensive MRSA colonization surveillance program. It is not intended to diagnose MRSA infection nor to guide or monitor treatment for MRSA infections. RESULT CALLED TO, READ BACK BY AND VERIFIED WITHSherlyn Lick RN 01/11/19 1912 JDW Performed at [facility] [address], [place], [place] [address]          Radiology Studies: No results found.      Scheduled Meds: . Chlorhexidine Gluconate Cloth  6 each Topical Q0600  . dextromethorphan-guaiFENesin  1 tablet Oral BID  . docusate sodium  100 mg Oral BID  . donepezil  10 mg Oral QHS  . heparin injection (subcutaneous)  5,000 Units Subcutaneous Q8H  . insulin aspart  0-9 Units Subcutaneous TID WC  . lamoTRIgine  50 mg Oral BID  . mupirocin ointment  1 application Nasal BID  . oseltamivir  30 mg Oral BID  . oxybutynin  5 mg Oral BID  . polyethylene glycol  17 g Oral Daily  . predniSONE  40 mg Oral Q breakfast  . senna  1 tablet Oral QHS  . vitamin B-12  500 mcg Oral Daily   Continuous Infusions:    LOS: 4 days        Aline August, MD [facility] 01/14/2019, 10:27 AM

## 2019-01-14 NOTE — Progress Notes (Signed)
Upon assessment this morning, patient had expiratory wheezing and increased work of breathing. Dr. Starla Link made aware. PRN Duoneb ordered with significant relief. Patient more comfortable with clear/diminished breath sounds. Will continue to monitor.   Hiram Comber, RN 01/14/2019 6:19 PM

## 2019-01-15 LAB — CBC
HCT: 37.6 % (ref 36.0–46.0)
Hemoglobin: 12.8 g/dL (ref 12.0–15.0)
MCH: 28.3 pg (ref 26.0–34.0)
MCHC: 34 g/dL (ref 30.0–36.0)
MCV: 83 fL (ref 80.0–100.0)
Platelets: 149 10*3/uL — ABNORMAL LOW (ref 150–400)
RBC: 4.53 MIL/uL (ref 3.87–5.11)
RDW: 13 % (ref 11.5–15.5)
WBC: 3.8 10*3/uL — ABNORMAL LOW (ref 4.0–10.5)
nRBC: 0 % (ref 0.0–0.2)

## 2019-01-15 LAB — BASIC METABOLIC PANEL
Anion gap: 9 (ref 5–15)
BUN: 11 mg/dL (ref 8–23)
CO2: 23 mmol/L (ref 22–32)
Calcium: 8.7 mg/dL — ABNORMAL LOW (ref 8.9–10.3)
Chloride: 102 mmol/L (ref 98–111)
Creatinine, Ser: 0.76 mg/dL (ref 0.44–1.00)
GFR calc Af Amer: >60 mL/min (ref >60)
Glucose, Bld: 182 mg/dL — ABNORMAL HIGH (ref 70–99)
POTASSIUM: 4.5 mmol/L (ref 3.5–5.1)
Sodium: 134 mmol/L — ABNORMAL LOW (ref 135–145)

## 2019-01-15 LAB — GLUCOSE, CAPILLARY
GLUCOSE-CAPILLARY: 144 mg/dL — AB (ref 70–99)
GLUCOSE-CAPILLARY: 270 mg/dL — AB (ref 70–99)
Glucose-Capillary: 240 mg/dL — ABNORMAL HIGH (ref 70–99)
Glucose-Capillary: 364 mg/dL — ABNORMAL HIGH (ref 70–99)

## 2019-01-15 LAB — CULTURE, BLOOD (ROUTINE X 2)
Culture: NO GROWTH
Culture: NO GROWTH

## 2019-01-15 NOTE — Progress Notes (Signed)
Patient ID: MENDE BISWELL, female   DOB: [birth year], 85 y.o.   MRN: [id number]  PROGRESS NOTE    MARKISHA MEDING  AYT:[id number] DOB: Oct 08, 1925 DOA: 01/10/2019 PCP: Patient, No Pcp Per   Brief Narrative:  85 year old female with history of diabetes mellitus type 2, CVA presented from her SNF for evaluation of altered mental status.  Patient was initially hypotensive which improved with IV fluids.  She is admitted for pneumonia with influenza A and started on antibiotics, IV fluids.  Patient was initially very minimally responsive but later on became more responsive.  Assessment & Plan:   Principal Problem:   Septic shock ([place]) Active Problems:   HCAP (healthcare-associated pneumonia)   UTI (urinary tract infection)   Acute respiratory failure with hypoxia and hypercapnia (HCC)   AMS (altered mental status)   AKI (acute kidney injury) ([place])  Sepsis probably from influenza A -Treated with IV fluids.  Blood pressure has improved.  Blood cultures have been negative.  Urine is growing E. coli but is probably a colonizer -Patient was started on broad-spectrum antibiotics as well.  Broad-spectrum antibiotics were discontinued on 01/12/2019.  Patient has not spiked temperature since then.  Continue Tamiflu to complete 5-day course of therapy.  Prednisone 40 mg daily for 5 days. -Doubt that patient has bacterial HCAP  Acute metabolic encephalopathy in a patient with history of dementia  -Patient was somnolent on admission.  Now probably back to her baseline. -Monitor mental status.    Acute hypoxic respiratory failure -Probably secondary to influenza.  Currently on 2 L oxygen via nasal cannula.  Wean off as able.  Hypomagnesemia--we will treat with oral magnesium oxide  Thrombocytopenia -Questionable cause.  Monitor.  No signs of bleeding  Acute kidney injury  -Baseline creatinine 0.8-1.1.  Resolved.  Off IV fluids.    Diabetes mellitus type 2 -Sliding scale insulin  Dementia -Fall  precaution.  Delirium precautions   DVT prophylaxis: Subcutaneous heparin Code Status: DNR Family Communication: None at bedside Disposition Plan: SNF after completion of Tamiflu.  Consultants: None  Procedures: None  Antimicrobials: Vancomycin and cefepime 01/10/2019-01/11/2019 Tamiflu from 01/10/2019 onwards   Subjective: Patient seen and examined at bedside.  She is sleepy, wakes up slightly, does not answer any questions.  No overnight fever, vomiting. Objective: Vitals:   01/14/19 1020 01/14/19 1347 01/14/19 2106 01/15/19 0447  BP:  138/83 133/75 118/63  Pulse:  91 84 74  Resp:   18 18  Temp:  97.6 F (36.4 C) (!) 97.3 F (36.3 C) 98 F (36.7 C)  TempSrc:  Oral Oral Oral  SpO2: 98% 96% 100% 97%  Weight:      Height:        Intake/Output Summary (Last 24 hours) at 01/15/2019 0740 Last data filed at 01/15/2019 0520 Gross per 24 hour  Intake 676 ml  Output 1850 ml  Net -1174 ml   Filed Weights   01/10/19 1935  Weight: 76.2 kg    Examination:  General exam: Appears calm and comfortable.  Elderly female.  Sleepy, wakes up slightly, confused.  No acute  distress. Respiratory system: Bilateral decreased breath sounds at bases, some scattered crackles.  Mild wheezing Cardiovascular system: S1 & S2 heard, rate controlled Gastrointestinal system: Abdomen is nondistended, soft and nontender. Normal bowel sounds heard. Extremities: No cyanosis, edema    Data Reviewed: I have personally reviewed following labs and imaging studies  CBC: Recent Labs  Lab 01/10/19 1915 01/10/19 1945 01/12/19 0505 01/14/19 3557 01/15/19 3220  WBC 3.8*  --  4.5 4.2 3.8*  NEUTROABS 2.6  --   --   --   --   HGB 12.7 11.9* 11.5* 12.1 12.8  HCT 41.2 35.0* 37.3 37.5 37.6  MCV 90.2  --  89.7 85.8 83.0  PLT 133*  --  130* 132* 456*   Basic Metabolic Panel: Recent Labs  Lab 01/10/19 1915 01/10/19 1945 01/10/19 2114 01/11/19 0213 01/12/19 0505 01/14/19 0433 01/15/19 0356  NA 131*  135  --  136 136 136 134*  K 5.5* 3.9 3.8 3.9 4.0 3.8 4.5  CL 103  --   --  105 109 105 102  CO2 21*  --   --  17* 21* 24 23  GLUCOSE 210*  --   --  226* 100* 105* 182*  BUN 26*  --   --  21 13 7* 11  CREATININE 1.37*  --   --  1.37* 1.02* 0.76 0.76  CALCIUM 8.1*  --   --  8.0* 7.5* 8.3* 8.7*  MG  --   --   --   --   --  1.5*  --    GFR: Estimated Creatinine Clearance: 41.8 mL/min (by C-G formula based on SCr of 0.76 mg/dL). Liver Function Tests: Recent Labs  Lab 01/10/19 1915 01/11/19 0213  AST 46* 40  ALT 13 18  ALKPHOS 64 63  BILITOT 1.3* 0.4  PROT 5.4* 5.9*  ALBUMIN 2.6* 2.7*   No results for input(s): LIPASE, AMYLASE in the last 168 hours. No results for input(s): AMMONIA in the last 168 hours. Coagulation Profile: No results for input(s): INR, PROTIME in the last 168 hours. Cardiac Enzymes: No results for input(s): CKTOTAL, CKMB, CKMBINDEX, TROPONINI in the last 168 hours. BNP (last 3 results) No results for input(s): PROBNP in the last 8760 hours. HbA1C: No results for input(s): HGBA1C in the last 72 hours. CBG: Recent Labs  Lab 01/13/19 2214 01/14/19 0747 01/14/19 1143 01/14/19 1737 01/14/19 2103  GLUCAP 97 108* 152* 163* 253*   Lipid Profile: No results for input(s): CHOL, HDL, LDLCALC, TRIG, CHOLHDL, LDLDIRECT in the last 72 hours. Thyroid Function Tests: No results for input(s): TSH, T4TOTAL, FREET4, T3FREE, THYROIDAB in the last 72 hours. Anemia Panel: No results for input(s): VITAMINB12, FOLATE, FERRITIN, TIBC, IRON, RETICCTPCT in the last 72 hours. Sepsis Labs: Recent Labs  Lab 01/10/19 1915  LATICACIDVEN 1.2    Recent Results (from the past 240 hour(s))  Urine culture     Status: Abnormal   Collection Time: 01/10/19  6:49 PM  Result Value Ref Range Status   Specimen Description URINE, RANDOM  Final   Special Requests   Final    NONE Performed at [facility], [address] [place], [place] [address]    Culture (A)  Final     >=100,000 COLONIES/mL ESCHERICHIA COLI Confirmed Extended Spectrum Beta-Lactamase Producer (ESBL).  In bloodstream infections from ESBL organisms, carbapenems are preferred over piperacillin/tazobactam. They are shown to have a lower risk of mortality.    Report Status 01/13/2019 FINAL  Final   Organism ID, Bacteria ESCHERICHIA COLI (A)  Final      Susceptibility   Escherichia coli - MIC*    AMPICILLIN >=32 RESISTANT Resistant     CEFAZOLIN >=64 RESISTANT Resistant     CEFTRIAXONE >=64 RESISTANT Resistant     CIPROFLOXACIN >=4 RESISTANT Resistant     GENTAMICIN <=1 SENSITIVE Sensitive     IMIPENEM <=0.25 SENSITIVE Sensitive  NITROFURANTOIN 32 SENSITIVE Sensitive     TRIMETH/SULFA >=320 RESISTANT Resistant     AMPICILLIN/SULBACTAM >=32 RESISTANT Resistant     PIP/TAZO 8 SENSITIVE Sensitive     Extended ESBL POSITIVE Resistant     * >=100,000 COLONIES/mL ESCHERICHIA COLI  Blood Culture (routine x 2)     Status: None (Preliminary result)   Collection Time: 01/10/19  7:15 PM  Result Value Ref Range Status   Specimen Description BLOOD RIGHT ANTECUBITAL  Final   Special Requests   Final    BOTTLES DRAWN AEROBIC AND ANAEROBIC Blood Culture results may not be optimal due to an inadequate volume of blood received in culture bottles   Culture   Final    NO GROWTH 4 DAYS Performed at [facility], [place] [address], [place], [place] [address]    Report Status PENDING  Incomplete  Blood Culture (routine x 2)     Status: None (Preliminary result)   Collection Time: 01/10/19  7:20 PM  Result Value Ref Range Status   Specimen Description BLOOD RIGHT HAND  Final   Special Requests   Final    BOTTLES DRAWN AEROBIC ONLY Blood Culture results may not be optimal due to an inadequate volume of blood received in culture bottles   Culture   Final    NO GROWTH 4 DAYS Performed at [facility], [place] [address] [place], [place] [address]    Report Status PENDING  Incomplete  MRSA PCR  Screening     Status: Abnormal   Collection Time: 01/11/19  4:56 PM  Result Value Ref Range Status   MRSA by PCR POSITIVE (A) NEGATIVE Final    Comment:        The GeneXpert MRSA Assay (FDA approved for NASAL specimens only), is one component of a comprehensive MRSA colonization surveillance program. It is not intended to diagnose MRSA infection nor to guide or monitor treatment for MRSA infections. RESULT CALLED TO, READ BACK BY AND VERIFIED WITHSherlyn Lick RN 01/11/19 1912 JDW Performed at [facility] [address] [place], [place] [address]          Radiology Studies: No results found.      Scheduled Meds: . Chlorhexidine Gluconate Cloth  6 each Topical Q0600  . dextromethorphan-guaiFENesin  1 tablet Oral BID  . docusate sodium  100 mg Oral BID  . donepezil  10 mg Oral QHS  . heparin injection (subcutaneous)  5,000 Units Subcutaneous Q8H  . insulin aspart  0-9 Units Subcutaneous TID WC  . lamoTRIgine  50 mg Oral BID  . mouth rinse  15 mL Mouth Rinse BID  . mupirocin ointment  1 application Nasal BID  . oseltamivir  30 mg Oral BID  . oxybutynin  5 mg Oral BID  . polyethylene glycol  17 g Oral Daily  . predniSONE  40 mg Oral Q breakfast  . senna  1 tablet Oral QHS  . vitamin B-12  500 mcg Oral Daily   Continuous Infusions:    LOS: 5 days        Aline August, MD [facility] 01/15/2019, 7:40 AM

## 2019-01-16 LAB — BASIC METABOLIC PANEL
Anion gap: 8 (ref 5–15)
BUN: 12 mg/dL (ref 8–23)
CHLORIDE: 100 mmol/L (ref 98–111)
CO2: 26 mmol/L (ref 22–32)
Calcium: 9 mg/dL (ref 8.9–10.3)
Creatinine, Ser: 0.76 mg/dL (ref 0.44–1.00)
GFR calc Af Amer: >60 mL/min (ref >60)
GFR calc non Af Amer: >60 mL/min (ref >60)
Glucose, Bld: 185 mg/dL — ABNORMAL HIGH (ref 70–99)
Potassium: 3.9 mmol/L (ref 3.5–5.1)
Sodium: 134 mmol/L — ABNORMAL LOW (ref 135–145)

## 2019-01-16 LAB — CBC
HEMATOCRIT: 36.5 % (ref 36.0–46.0)
Hemoglobin: 11.7 g/dL — ABNORMAL LOW (ref 12.0–15.0)
MCH: 27.1 pg (ref 26.0–34.0)
MCHC: 32.1 g/dL (ref 30.0–36.0)
MCV: 84.5 fL (ref 80.0–100.0)
Platelets: 167 10*3/uL (ref 150–400)
RBC: 4.32 MIL/uL (ref 3.87–5.11)
RDW: 12.9 % (ref 11.5–15.5)
WBC: 6 10*3/uL (ref 4.0–10.5)
nRBC: 0 % (ref 0.0–0.2)

## 2019-01-16 LAB — GLUCOSE, CAPILLARY: Glucose-Capillary: 161 mg/dL — ABNORMAL HIGH (ref 70–99)

## 2019-01-16 MED ORDER — PREDNISONE 20 MG PO TABS: 40.0000 mg | ORAL_TABLET | Freq: Every day | ORAL | 0 refills | Status: AC

## 2019-01-16 MED ORDER — DM-GUAIFENESIN ER 30-600 MG PO TB12: 1.0000 | ORAL_TABLET | Freq: Two times a day (BID) | ORAL | Status: AC

## 2019-01-16 NOTE — Discharge Summary (Signed)
Physician Discharge Summary  Christine Summers AVW:[id number] DOB: May 29, 1925 DOA: 01/10/2019  PCP: Patient, No Pcp Per  Admit date: 01/10/2019 Discharge date: 01/16/2019  Admitted From: SNF Disposition:  SNF  Recommendations for Outpatient Follow-up:  1. Follow up with SNF provider at earliest convenience 2. Patient will benefit from outpatient palliative care evaluation for goals of care discussion 3. Follow up in ED if symptoms worsen or new appear   Home Health: No Equipment/Devices: None  Discharge Condition: Guarded  CODE STATUS: Poor Diet recommendation: Heart healthy/carb modified/as per SLP recommendations  Brief/Interim Summary: 85 year old female with history of diabetes mellitus type 2, CVA presented from her SNF for evaluation of altered mental status.  Patient was initially hypotensive which improved with IV fluids.  She is admitted for pneumonia with influenza A and started on antibiotics, IV fluids.  Patient was initially very minimally responsive but later on became more responsive.  Antibiotics were discontinued.  She is also started on oral prednisone.  She will complete her Tamiflu treatment today.  She is stable for discharge back to SNF.  Discharge Diagnoses:  Principal Problem:   Septic shock ([place]) Active Problems:   HCAP (healthcare-associated pneumonia)   UTI (urinary tract infection)   Acute respiratory failure with hypoxia and hypercapnia (HCC)   AMS (altered mental status)   AKI (acute kidney injury) ([place])  Sepsis probably from influenza A -Treated with IV fluids.  Blood pressure has improved.  Blood cultures have been negative.  Urine is growing E. coli but is probably a colonizer -Patient was started on broad-spectrum antibiotics as well.  Broad-spectrum antibiotics were discontinued on 01/12/2019.  Patient has not spiked temperature since then.   -Today's her fifth day of Tamiflu.  Continue prednisone 40 mg daily for 2 more days.  -Doubt that patient has  bacterial HCAP -We will discharge patient to SNF today.  Diet as per SLP recommendations.  Acute metabolic encephalopathy in a patient with history of dementia  -Patient was somnolent on admission.  Now probably back to her baseline. -Monitor mental status.   -Outpatient follow-up   Acute hypoxic respiratory failure -Probably secondary to influenza.  Resolved.  Hypomagnesemia--treated with oral replacement  Thrombocytopenia -Questionable cause.  Resolved.  Acute kidney injury  -Baseline creatinine 0.8-1.1.  Resolved.  Off IV fluids.    Diabetes mellitus type 2 -Continue outpatient regimen   dementia -Fall precaution.  Delirium precautions -Might benefit from outpatient palliative care evaluation for goals of care.   Discharge Instructions  Discharge Instructions    Call MD for:  difficulty breathing, headache or visual disturbances   Complete by:  As directed    Call MD for:  extreme fatigue   Complete by:  As directed    Call MD for:  hives   Complete by:  As directed    Call MD for:  persistant dizziness or light-headedness   Complete by:  As directed    Call MD for:  persistant nausea and vomiting   Complete by:  As directed    Call MD for:  severe uncontrolled pain   Complete by:  As directed    Call MD for:  temperature >100.4   Complete by:  As directed    Diet - low sodium heart healthy   Complete by:  As directed    Diet Carb Modified   Complete by:  As directed    Increase activity slowly   Complete by:  As directed      Allergies as of  01/16/2019      Reactions   Codeine Other (See Comments)   hallucinations      Medication List    TAKE these medications   acetaminophen 325 MG tablet Commonly known as:  TYLENOL Take 650 mg by mouth every 6 (six) hours as needed (pain).   aspirin 81 MG chewable tablet Chew 81 mg by mouth daily.   benzonatate 100 MG capsule Commonly known as:  TESSALON Take 100 mg by mouth 3 (three) times daily as  needed for cough.   dextromethorphan-guaiFENesin 30-600 MG 12hr tablet Commonly known as:  MUCINEX DM Take 1 tablet by mouth 2 (two) times daily for 10 days.   docusate sodium 100 MG capsule Commonly known as:  COLACE Take 100 mg by mouth 2 (two) times daily.   donepezil 10 MG tablet Commonly known as:  ARICEPT Take 10 mg by mouth at bedtime.   insulin glargine 100 UNIT/ML injection Commonly known as:  LANTUS Inject 18 Units into the skin at bedtime.   ipratropium-albuterol 0.5-2.5 (3) MG/3ML Soln Commonly known as:  DUONEB Take 3 mLs by nebulization every 6 (six) hours as needed (for wheezing).   lamoTRIgine 25 MG tablet Commonly known as:  LAMICTAL Take 50 mg by mouth 2 (two) times daily.   Melatonin 3 MG Tabs Take 3 mg by mouth at bedtime.   multivitamin with minerals Tabs tablet Take 1 tablet by mouth daily.   NAMENDA XR 28 MG Cp24 24 hr capsule Generic drug:  memantine Take 28 mg by mouth.   oxybutynin 5 MG tablet Commonly known as:  DITROPAN Take 5 mg by mouth 2 (two) times daily.   polyethylene glycol packet Commonly known as:  MIRALAX / GLYCOLAX Take 17 g by mouth daily.   predniSONE 20 MG tablet Commonly known as:  DELTASONE Take 2 tablets (40 mg total) by mouth daily with breakfast for 2 days. Start taking on:  January 17, 2019   senna 8.6 MG tablet Commonly known as:  SENOKOT Take 1 tablet by mouth at bedtime.   vitamin B-12 500 MCG tablet Commonly known as:  CYANOCOBALAMIN Take 500 mcg by mouth daily.   Vitamin D3 50 MCG (2000 UT) Tabs Take 2,000 Units by mouth daily.      Contact information for after-discharge care    Destination    [facility] .   Service:  Skilled Nursing Contact information: [address] [place] [phone]             Allergies  Allergen Reactions  . Codeine Other (See Comments)    hallucinations     Consultations:  None   Procedures/Studies: Dg Chest Port 1 View  Result Date: 01/10/2019 CLINICAL DATA:  85 year old nursing home patient presenting with acute mental status changes as she is not responding to questions as is her baseline. Hypotension upon EMS arrival. EXAM: PORTABLE CHEST 1 VIEW COMPARISON:  None. FINDINGS: Markedly suboptimal inspiration. Cardiac silhouette normal in size for technique. Thoracic aorta atherosclerotic. Asymmetric patchy opacities at the LEFT lung base. Lungs otherwise clear. Pulmonary vascularity normal. IMPRESSION: Markedly suboptimal inspiration. Atelectasis versus bronchopneumonia at the LEFT lung base. Electronically Signed   By: Evangeline Dakin M.D.   On: 01/10/2019 18:24       Subjective: Patient seen and examined at bedside.  She is awake but confused.  Does not answer questions appropriately.  No overnight fever or vomiting.  Discharge Exam: Vitals:   01/15/19 2246 01/16/19 2094  BP: 137/75 137/71  Pulse: 83 (!) 58  Resp: 15 15  Temp: 98 F (36.7 C) 97.7 F (36.5 C)  SpO2: 98% 100%     General: Pt is elderly female lying in bed.  No distress.  Awake but confused. Cardiovascular: Slight bradycardia, S1/S2 + Respiratory: bilateral decreased breath sounds at bases, mild wheezing Abdominal: Soft, NT, ND, bowel sounds + Extremities: Trace edema, no cyanosis    The results of significant diagnostics from this hospitalization (including imaging, microbiology, ancillary and laboratory) are listed below for reference.     Microbiology: Recent Results (from the past 240 hour(s))  Urine culture     Status: Abnormal   Collection Time: 01/10/19  6:49 PM  Result Value Ref Range Status   Specimen Description URINE, RANDOM  Final   Special Requests   Final    NONE Performed at [facility], [address], [place], [place] [address]    Culture (A)  Final    >=100,000 COLONIES/mL ESCHERICHIA COLI Confirmed Extended Spectrum  Beta-Lactamase Producer (ESBL).  In bloodstream infections from ESBL organisms, carbapenems are preferred over piperacillin/tazobactam. They are shown to have a lower risk of mortality.    Report Status 01/13/2019 FINAL  Final   Organism ID, Bacteria ESCHERICHIA COLI (A)  Final      Susceptibility   Escherichia coli - MIC*    AMPICILLIN >=32 RESISTANT Resistant     CEFAZOLIN >=64 RESISTANT Resistant     CEFTRIAXONE >=64 RESISTANT Resistant     CIPROFLOXACIN >=4 RESISTANT Resistant     GENTAMICIN <=1 SENSITIVE Sensitive     IMIPENEM <=0.25 SENSITIVE Sensitive     NITROFURANTOIN 32 SENSITIVE Sensitive     TRIMETH/SULFA >=320 RESISTANT Resistant     AMPICILLIN/SULBACTAM >=32 RESISTANT Resistant     PIP/TAZO 8 SENSITIVE Sensitive     Extended ESBL POSITIVE Resistant     * >=100,000 COLONIES/mL ESCHERICHIA COLI  Blood Culture (routine x 2)     Status: None   Collection Time: 01/10/19  7:15 PM  Result Value Ref Range Status   Specimen Description BLOOD RIGHT ANTECUBITAL  Final   Special Requests   Final    BOTTLES DRAWN AEROBIC AND ANAEROBIC Blood Culture results may not be optimal due to an inadequate volume of blood received in culture bottles   Culture   Final    NO GROWTH 5 DAYS Performed at [facility], [address], [place], [place] [address]    Report Status 01/15/2019 FINAL  Final  Blood Culture (routine x 2)     Status: None   Collection Time: 01/10/19  7:20 PM  Result Value Ref Range Status   Specimen Description BLOOD RIGHT HAND  Final   Special Requests   Final    BOTTLES DRAWN AEROBIC ONLY Blood Culture results may not be optimal due to an inadequate volume of blood received in culture bottles   Culture   Final    NO GROWTH 5 DAYS Performed at [facility], [place] [address] [place], [place] [address]    Report Status 01/15/2019 FINAL  Final  MRSA PCR Screening     Status: Abnormal   Collection Time: 01/11/19  4:56 PM  Result Value Ref Range Status    MRSA by PCR POSITIVE (A) NEGATIVE Final    Comment:        The GeneXpert MRSA Assay (FDA approved for NASAL specimens only), is one component of a comprehensive MRSA colonization surveillance program.  It is not intended to diagnose MRSA infection nor to guide or monitor treatment for MRSA infections. RESULT CALLED TO, READ BACK BY AND VERIFIED WITHSherlyn Lick RN 01/11/19 1912 JDW Performed at [facility] [address] [place], [place] [address]      Labs: BNP (last 3 results) No results for input(s): BNP in the last 8760 hours. Basic Metabolic Panel: Recent Labs  Lab 01/11/19 0213 01/12/19 0505 01/14/19 0433 01/15/19 0356 01/16/19 0555  NA 136 136 136 134* 134*  K 3.9 4.0 3.8 4.5 3.9  CL 105 109 105 102 100  CO2 17* 21* 24 23 26   GLUCOSE 226* 100* 105* 182* 185*  BUN 21 13 7* 11 12  CREATININE 1.37* 1.02* 0.76 0.76 0.76  CALCIUM 8.0* 7.5* 8.3* 8.7* 9.0  MG  --   --  1.5*  --   --    Liver Function Tests: Recent Labs  Lab 01/10/19 1915 01/11/19 0213  AST 46* 40  ALT 13 18  ALKPHOS 64 63  BILITOT 1.3* 0.4  PROT 5.4* 5.9*  ALBUMIN 2.6* 2.7*   No results for input(s): LIPASE, AMYLASE in the last 168 hours. No results for input(s): AMMONIA in the last 168 hours. CBC: Recent Labs  Lab 01/10/19 1915 01/10/19 1945 01/12/19 0505 01/14/19 0433 01/15/19 0356 01/16/19 0555  WBC 3.8*  --  4.5 4.2 3.8* 6.0  NEUTROABS 2.6  --   --   --   --   --   HGB 12.7 11.9* 11.5* 12.1 12.8 11.7*  HCT 41.2 35.0* 37.3 37.5 37.6 36.5  MCV 90.2  --  89.7 85.8 83.0 84.5  PLT 133*  --  130* 132* 149* 167   Cardiac Enzymes: No results for input(s): CKTOTAL, CKMB, CKMBINDEX, TROPONINI in the last 168 hours. BNP: Invalid input(s): POCBNP CBG: Recent Labs  Lab 01/15/19 0828 01/15/19 1237 01/15/19 1653 01/15/19 2253 01/16/19 0815  GLUCAP 144* 240* 364* 270* 161*   D-Dimer No results for input(s): DDIMER in the last 72 hours. Hgb A1c No results for input(s):  HGBA1C in the last 72 hours. Lipid Profile No results for input(s): CHOL, HDL, LDLCALC, TRIG, CHOLHDL, LDLDIRECT in the last 72 hours. Thyroid function studies No results for input(s): TSH, T4TOTAL, T3FREE, THYROIDAB in the last 72 hours.  Invalid input(s): FREET3 Anemia work up No results for input(s): VITAMINB12, FOLATE, FERRITIN, TIBC, IRON, RETICCTPCT in the last 72 hours. Urinalysis    Component Value Date/Time   COLORURINE AMBER (A) 01/10/2019 1849   APPEARANCEUR HAZY (A) 01/10/2019 1849   APPEARANCEUR Hazy 05/15/2014 1125   LABSPEC 1.020 01/10/2019 1849   LABSPEC 1.019 05/15/2014 1125   PHURINE 5.0 01/10/2019 1849   GLUCOSEU NEGATIVE 01/10/2019 1849   GLUCOSEU Negative 05/15/2014 1125   HGBUR NEGATIVE 01/10/2019 [place] NEGATIVE 01/10/2019 1849   BILIRUBINUR Negative 05/15/2014 1125   [place] 01/10/2019 1849   PROTEINUR NEGATIVE 01/10/2019 1849   NITRITE NEGATIVE 01/10/2019 1849   LEUKOCYTESUR TRACE (A) 01/10/2019 1849   LEUKOCYTESUR Trace 05/15/2014 1125   Sepsis Labs Invalid input(s): PROCALCITONIN,  WBC,  LACTICIDVEN Microbiology Recent Results (from the past 240 hour(s))  Urine culture     Status: Abnormal   Collection Time: 01/10/19  6:49 PM  Result Value Ref Range Status   Specimen Description URINE, RANDOM  Final   Special Requests   Final    NONE Performed at [facility], [place] [address], [place], [place] [address]  Culture (A)  Final    >=100,000 COLONIES/mL ESCHERICHIA COLI Confirmed Extended Spectrum Beta-Lactamase Producer (ESBL).  In bloodstream infections from ESBL organisms, carbapenems are preferred over piperacillin/tazobactam. They are shown to have a lower risk of mortality.    Report Status 01/13/2019 FINAL  Final   Organism ID, Bacteria ESCHERICHIA COLI (A)  Final      Susceptibility   Escherichia coli - MIC*    AMPICILLIN >=32 RESISTANT Resistant     CEFAZOLIN >=64 RESISTANT Resistant     CEFTRIAXONE >=64  RESISTANT Resistant     CIPROFLOXACIN >=4 RESISTANT Resistant     GENTAMICIN <=1 SENSITIVE Sensitive     IMIPENEM <=0.25 SENSITIVE Sensitive     NITROFURANTOIN 32 SENSITIVE Sensitive     TRIMETH/SULFA >=320 RESISTANT Resistant     AMPICILLIN/SULBACTAM >=32 RESISTANT Resistant     PIP/TAZO 8 SENSITIVE Sensitive     Extended ESBL POSITIVE Resistant     * >=100,000 COLONIES/mL ESCHERICHIA COLI  Blood Culture (routine x 2)     Status: None   Collection Time: 01/10/19  7:15 PM  Result Value Ref Range Status   Specimen Description BLOOD RIGHT ANTECUBITAL  Final   Special Requests   Final    BOTTLES DRAWN AEROBIC AND ANAEROBIC Blood Culture results may not be optimal due to an inadequate volume of blood received in culture bottles   Culture   Final    NO GROWTH 5 DAYS Performed at [facility], [address], [place], [place] [address]    Report Status 01/15/2019 FINAL  Final  Blood Culture (routine x 2)     Status: None   Collection Time: 01/10/19  7:20 PM  Result Value Ref Range Status   Specimen Description BLOOD RIGHT HAND  Final   Special Requests   Final    BOTTLES DRAWN AEROBIC ONLY Blood Culture results may not be optimal due to an inadequate volume of blood received in culture bottles   Culture   Final    NO GROWTH 5 DAYS Performed at [facility], [place] [address], [place], [place] [address]    Report Status 01/15/2019 FINAL  Final  MRSA PCR Screening     Status: Abnormal   Collection Time: 01/11/19  4:56 PM  Result Value Ref Range Status   MRSA by PCR POSITIVE (A) NEGATIVE Final    Comment:        The GeneXpert MRSA Assay (FDA approved for NASAL specimens only), is one component of a comprehensive MRSA colonization surveillance program. It is not intended to diagnose MRSA infection nor to guide or monitor treatment for MRSA infections. RESULT CALLED TO, READ BACK BY AND VERIFIED WITHSherlyn Lick RN 01/11/19 1912 JDW Performed at [facility] [address] [place], [place] [address]      Time coordinating discharge: 35 minutes  SIGNED:   Aline August, MD  [facility] 01/16/2019, 9:34 AM

## 2019-01-16 NOTE — Progress Notes (Signed)
Patient will DC to: [facility] Anticipated DC date: 01/16/19 Family notified: Daughter, Hassan Rowan Transport by: Corey Harold   Per MD patient ready for DC to [facility]. RN, patient, patient's family, and facility notified of DC. Discharge Summary and FL2 sent to facility. RN to call report prior to discharge [phone]). DC packet on chart. Ambulance transport requested for patient.   CSW will sign off for now as social work intervention is no longer needed. Please consult [place] again if new needs arise.  Cedric Fishman, LCSW Clinical Social Worker [phone]

## 2019-01-16 NOTE — Discharge Planning (Signed)
Nsg Discharge Note  Admit Date:  01/10/2019 Discharge date: 01/16/2019   Christine Summers to be D/C'd Skilled nursing facility per MD order.  Discharge Medication: Allergies as of 01/16/2019      Reactions   Codeine Other (See Comments)   hallucinations      Medication List    TAKE these medications   acetaminophen 325 MG tablet Commonly known as:  TYLENOL Take 650 mg by mouth every 6 (six) hours as needed (pain).   aspirin 81 MG chewable tablet Chew 81 mg by mouth daily.   benzonatate 100 MG capsule Commonly known as:  TESSALON Take 100 mg by mouth 3 (three) times daily as needed for cough.   dextromethorphan-guaiFENesin 30-600 MG 12hr tablet Commonly known as:  MUCINEX DM Take 1 tablet by mouth 2 (two) times daily for 10 days.   docusate sodium 100 MG capsule Commonly known as:  COLACE Take 100 mg by mouth 2 (two) times daily.   donepezil 10 MG tablet Commonly known as:  ARICEPT Take 10 mg by mouth at bedtime.   insulin glargine 100 UNIT/ML injection Commonly known as:  LANTUS Inject 18 Units into the skin at bedtime.   ipratropium-albuterol 0.5-2.5 (3) MG/3ML Soln Commonly known as:  DUONEB Take 3 mLs by nebulization every 6 (six) hours as needed (for wheezing).   lamoTRIgine 25 MG tablet Commonly known as:  LAMICTAL Take 50 mg by mouth 2 (two) times daily.   Melatonin 3 MG Tabs Take 3 mg by mouth at bedtime.   multivitamin with minerals Tabs tablet Take 1 tablet by mouth daily.   NAMENDA XR 28 MG Cp24 24 hr capsule Generic drug:  memantine Take 28 mg by mouth.   oxybutynin 5 MG tablet Commonly known as:  DITROPAN Take 5 mg by mouth 2 (two) times daily.   polyethylene glycol packet Commonly known as:  MIRALAX / GLYCOLAX Take 17 g by mouth daily.   predniSONE 20 MG tablet Commonly known as:  DELTASONE Take 2 tablets (40 mg total) by mouth daily with breakfast for 2 days. Start taking on:  January 17, 2019   senna 8.6 MG tablet Commonly known as:   SENOKOT Take 1 tablet by mouth at bedtime.   vitamin B-12 500 MCG tablet Commonly known as:  CYANOCOBALAMIN Take 500 mcg by mouth daily.   Vitamin D3 50 MCG (2000 UT) Tabs Take 2,000 Units by mouth daily.       Discharge Assessment: Vitals:   01/15/19 2246 01/16/19 0537  BP: 137/75 137/71  Pulse: 83 (!) 58  Resp: 15 15  Temp: 98 F (36.7 C) 97.7 F (36.5 C)  SpO2: 98% 100%    D/c Instructions-Education: Discharge instructions given to patient with no signs of learning. AVS instructions given to receiving nurse at patient's skilled nursing facility, including follow up instructions, medication list, d/c activities limitations if indicated, with other d/c instructions as indicated by MD - receiving nurse able to verbalize understanding, all questions fully answered. Patient instructed to return to ED, call 911, or call MD for any changes in condition.  Patient escorted via stretcher and ptar ambulance services.   Hiram Comber, RN 01/16/2019 12:19 PM

## 2019-01-16 NOTE — Progress Notes (Signed)
Called report to [facility]. Answered all questions/concerns to receiving nurse's satisfaction.    Hiram Comber, RN 01/16/2019 12:00 PM

## 2019-03-17 DEATH — deceased

## 2020-03-22 IMAGING — DX DG CHEST 1V PORT
1 series · 1 of 1 positions shown · non-contrast
Comparison: None.

CLINICAL DATA: [AGE] [HOSPITAL] patient presenting with
acute mental status changes as she is not responding to questions as
is her baseline. Hypotension upon EMS arrival.

EXAM:
PORTABLE CHEST 1 VIEW

[chest ap]
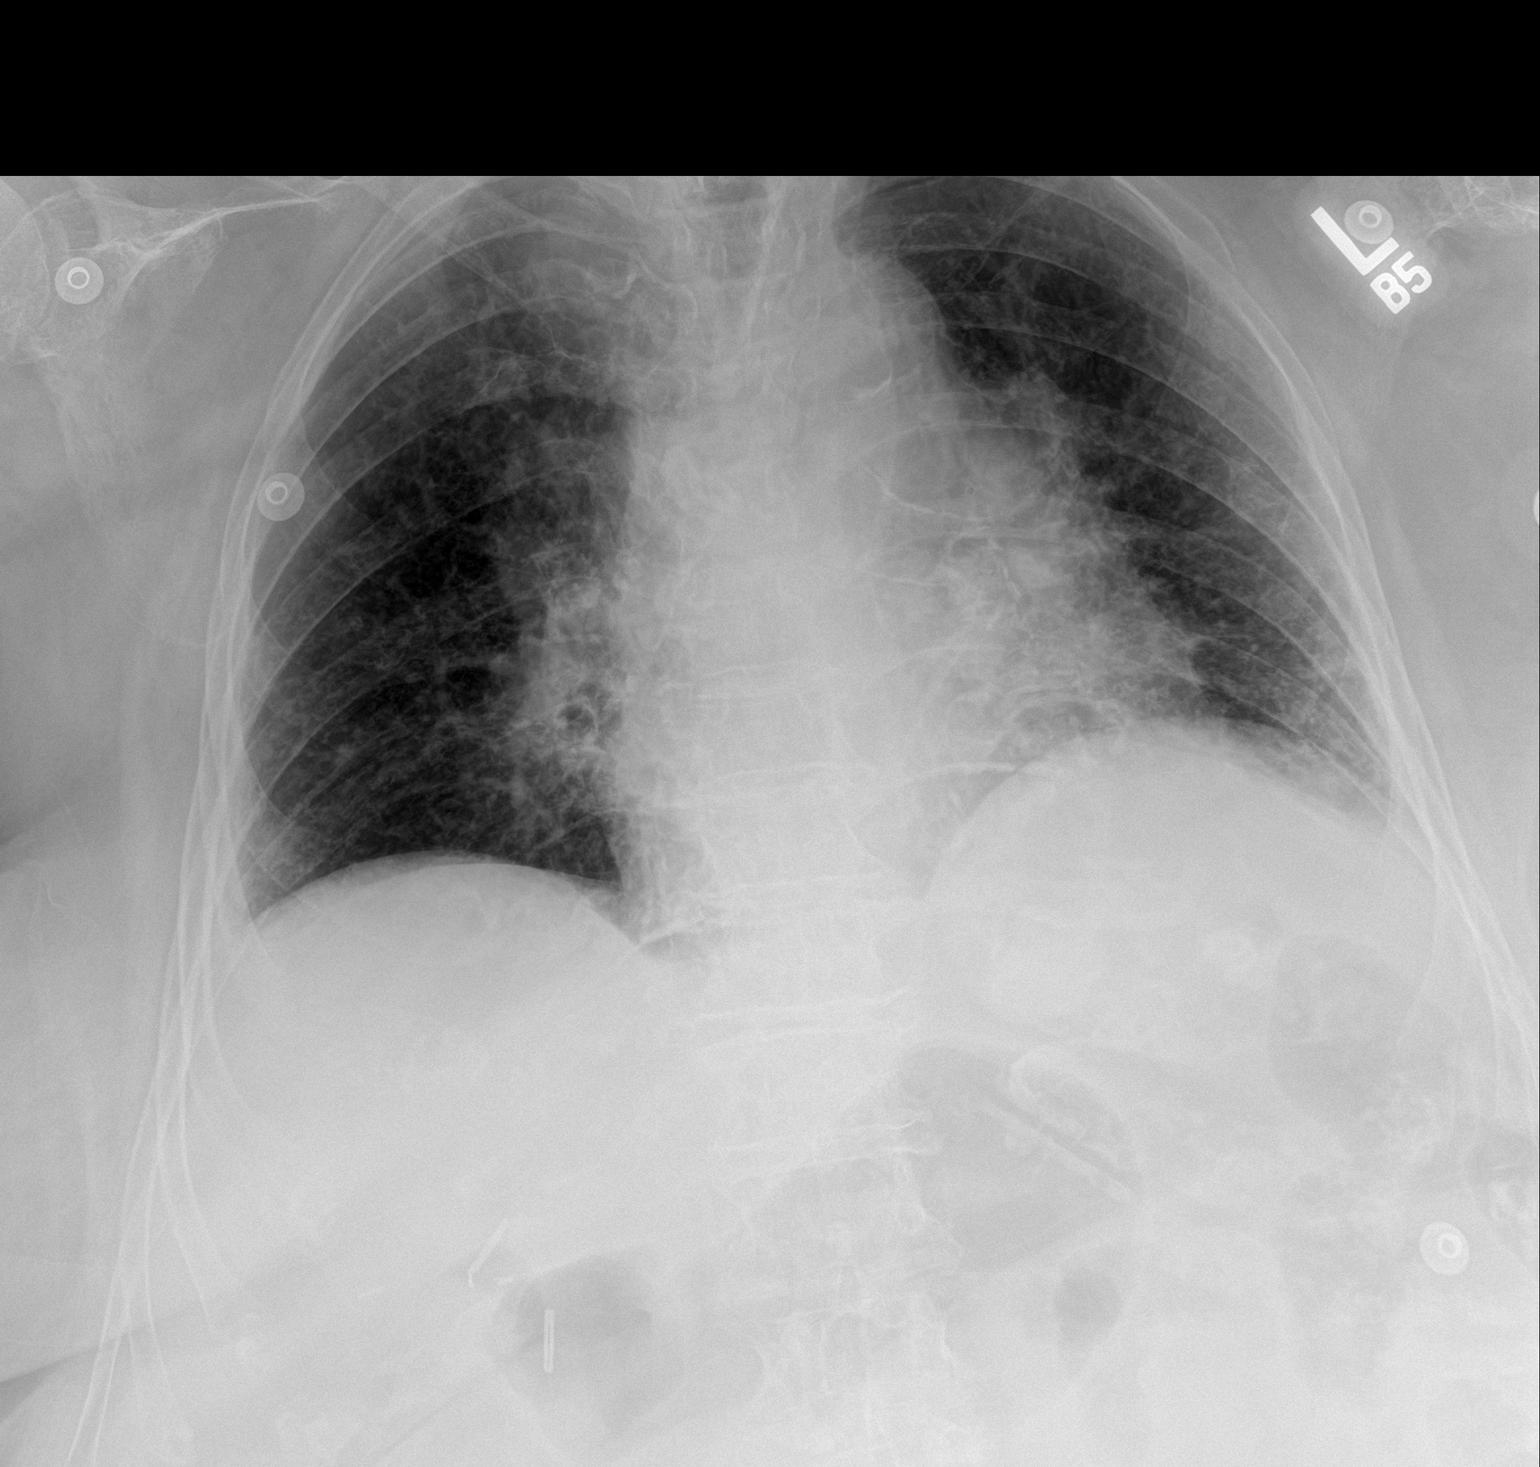

[1 of 1 positions shown; findings below may reference images not displayed]

FINDINGS: Markedly suboptimal inspiration. Cardiac silhouette normal in size
for technique. Thoracic aorta atherosclerotic. Asymmetric patchy
opacities at the LEFT lung base. Lungs otherwise clear. Pulmonary
vascularity normal.
IMPRESSION: Markedly suboptimal inspiration. Atelectasis versus bronchopneumonia
at the LEFT lung base.
